# Patient Record
Sex: Female | Born: 1995 | Race: White | Hispanic: No | Marital: Married | Smoking: Never smoker
Health system: Southern US, Community
[De-identification: ages and names within clinical notes are randomized; demographics above are authoritative.]

## PROBLEM LIST (undated history)

## (undated) DIAGNOSIS — D649 Anemia, unspecified: Secondary | ICD-10-CM

## (undated) DIAGNOSIS — N946 Dysmenorrhea, unspecified: Secondary | ICD-10-CM

## (undated) DIAGNOSIS — Z7689 Persons encountering health services in other specified circumstances: Principal | ICD-10-CM

## (undated) DIAGNOSIS — G43909 Migraine, unspecified, not intractable, without status migrainosus: Secondary | ICD-10-CM

## (undated) DIAGNOSIS — N926 Irregular menstruation, unspecified: Secondary | ICD-10-CM

## (undated) DIAGNOSIS — J302 Other seasonal allergic rhinitis: Secondary | ICD-10-CM

## (undated) DIAGNOSIS — F419 Anxiety disorder, unspecified: Secondary | ICD-10-CM

## (undated) HISTORY — PX: TONSILLECTOMY AND ADENOIDECTOMY: SUR1326

## (undated) HISTORY — DX: Anemia, unspecified: D64.9

## (undated) HISTORY — DX: Irregular menstruation, unspecified: N92.6

## (undated) HISTORY — DX: Migraine, unspecified, not intractable, without status migrainosus: G43.909

## (undated) HISTORY — DX: Other seasonal allergic rhinitis: J30.2

## (undated) HISTORY — DX: Persons encountering health services in other specified circumstances: Z76.89

## (undated) HISTORY — PX: WISDOM TOOTH EXTRACTION: SHX21

## (undated) HISTORY — DX: Dysmenorrhea, unspecified: N94.6

---
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
## 2015-03-06 ENCOUNTER — Other Ambulatory Visit (HOSPITAL_COMMUNITY): Payer: Self-pay | Admitting: Family Medicine

## 2015-03-06 DIAGNOSIS — R519 Headache, unspecified: Secondary | ICD-10-CM

## 2015-03-06 DIAGNOSIS — R51 Headache: Principal | ICD-10-CM

## 2015-03-15 ENCOUNTER — Ambulatory Visit (HOSPITAL_COMMUNITY)
Admission: RE | Admit: 2015-03-15 | Discharge: 2015-03-15 | Disposition: A | Discharge status: Home / Self Care | Payer: BLUE CROSS/BLUE SHIELD | Source: Ambulatory Visit | Attending: Family Medicine | Admitting: Family Medicine

## 2015-03-15 DIAGNOSIS — Z68.41 Body mass index (BMI) pediatric, less than 5th percentile for age: Secondary | ICD-10-CM | POA: Insufficient documentation

## 2015-03-15 DIAGNOSIS — R590 Localized enlarged lymph nodes: Secondary | ICD-10-CM | POA: Insufficient documentation

## 2015-03-15 DIAGNOSIS — R519 Headache, unspecified: Secondary | ICD-10-CM

## 2015-03-15 DIAGNOSIS — R51 Headache: Secondary | ICD-10-CM | POA: Diagnosis present

## 2015-03-15 DIAGNOSIS — E049 Nontoxic goiter, unspecified: Secondary | ICD-10-CM | POA: Diagnosis present

## 2015-03-15 MED ORDER — GADOBENATE DIMEGLUMINE 529 MG/ML IV SOLN
7.0000 mL | Freq: Once | INTRAVENOUS | Status: AC | PRN
  Administered 2015-03-15: 7 mL via INTRAVENOUS

## 2015-04-03 ENCOUNTER — Ambulatory Visit (INDEPENDENT_AMBULATORY_CARE_PROVIDER_SITE_OTHER): Payer: BLUE CROSS/BLUE SHIELD | Admitting: Adult Health

## 2015-04-03 ENCOUNTER — Encounter: Payer: Self-pay | Admitting: Adult Health

## 2015-04-03 VITALS — BP 122/70 | HR 82 | Ht 67.0 in | Wt 154.0 lb

## 2015-04-03 DIAGNOSIS — N926 Irregular menstruation, unspecified: Secondary | ICD-10-CM | POA: Diagnosis not present

## 2015-04-03 DIAGNOSIS — Z3202 Encounter for pregnancy test, result negative: Secondary | ICD-10-CM | POA: Diagnosis not present

## 2015-04-03 HISTORY — DX: Irregular menstruation, unspecified: N92.6

## 2015-04-03 LAB — POCT URINE PREGNANCY: Preg Test, Ur: NEGATIVE

## 2015-04-03 MED ORDER — NORETHIN ACE-ETH ESTRAD-FE 1-20 MG-MCG(24) PO CAPS: 1.0000 | ORAL_CAPSULE | Freq: Every day | ORAL | Status: DC

## 2015-04-03 NOTE — Patient Instructions (Signed)
Finish then start taytulla Use condoms

## 2015-04-03 NOTE — Progress Notes (Signed)
Subjective:     Patient ID: Cathy Bailey, female   DOB: 10/30/1995, 20 y.o.   MRN: [id number]  HPI Cathy Bailey is a 20 year old white female, single in complaining of irregular bleeding with junel for several months but then 1 period lasted a week, denies any discharge, burning or pain, no new meds or partners.Freshman at [employer], and [employer].  Review of Systems Patient denies any headaches, hearing loss, fatigue, blurred vision, shortness of breath, chest pain, abdominal pain, problems with bowel movements, urination, or intercourse. No joint pain or mood swings.+irregular periods Reviewed past medical,surgical, social and family history. Reviewed medications and allergies.     Objective:   Physical Exam BP 122/70 mmHg  Pulse 82  Ht  (1.702 m)  Wt 154 lb (69.854 kg)  BMI 24.11 kg/m2  LMP 03/24/2015 UPT negative, talk only, face time 10 minutes, will try different pill to see if irregularity stops and use condoms,finish current pack of pills then start Taytulla.    Assessment:     Irregular periods    Plan:     GC/CHL on urine sent Given 3 packs Taytulla to try , lot [id number] exp 8/18 Will talk in 3 months Use condoms

## 2015-04-06 LAB — GC/CHLAMYDIA PROBE AMP
CHLAMYDIA, DNA PROBE: NEGATIVE
Neisseria gonorrhoeae by PCR: NEGATIVE

## 2015-06-17 ENCOUNTER — Encounter: Payer: Self-pay | Admitting: Adult Health

## 2015-06-18 ENCOUNTER — Other Ambulatory Visit: Payer: Self-pay | Admitting: Adult Health

## 2015-06-18 MED ORDER — NORETHIN ACE-ETH ESTRAD-FE 1-20 MG-MCG(24) PO CAPS: 1.0000 | ORAL_CAPSULE | Freq: Every day | ORAL | Status: DC

## 2015-08-23 ENCOUNTER — Encounter: Payer: Self-pay | Admitting: Adult Health

## 2015-08-29 ENCOUNTER — Emergency Department (HOSPITAL_COMMUNITY)
Admission: EM | Admit: 2015-08-29 | Discharge: 2015-08-30 | Disposition: A | Discharge status: Home / Self Care | Payer: BLUE CROSS/BLUE SHIELD | Attending: Emergency Medicine | Admitting: Emergency Medicine

## 2015-08-29 ENCOUNTER — Encounter (HOSPITAL_COMMUNITY): Payer: Self-pay

## 2015-08-29 DIAGNOSIS — Z792 Long term (current) use of antibiotics: Secondary | ICD-10-CM | POA: Insufficient documentation

## 2015-08-29 DIAGNOSIS — R112 Nausea with vomiting, unspecified: Secondary | ICD-10-CM | POA: Insufficient documentation

## 2015-08-29 DIAGNOSIS — R05 Cough: Secondary | ICD-10-CM | POA: Diagnosis present

## 2015-08-29 DIAGNOSIS — Z9089 Acquired absence of other organs: Secondary | ICD-10-CM | POA: Diagnosis not present

## 2015-08-29 DIAGNOSIS — Z79899 Other long term (current) drug therapy: Secondary | ICD-10-CM | POA: Diagnosis not present

## 2015-08-29 DIAGNOSIS — J029 Acute pharyngitis, unspecified: Secondary | ICD-10-CM | POA: Insufficient documentation

## 2015-08-29 LAB — RAPID STREP SCREEN (MED CTR MEBANE ONLY): STREPTOCOCCUS, GROUP A SCREEN (DIRECT): NEGATIVE

## 2015-08-29 NOTE — ED Notes (Signed)
Bed: [facility] Expected date:  Expected time:  Means of arrival:  Comments: 

## 2015-08-29 NOTE — ED Notes (Addendum)
Patient states that has had a cough and sore throat since Monday this week.  Patient states that saw PCP Tuesday and was given z-pak.  Patient was also tested for strep which was negative.  Patient states that has been running fevers at home.   Patient states still has sore throat and states that feels like "has silly putty" in her throat and is difficult to swallow,  Patient denies SOB or difficulty breathing. Patient is talking and laughing in triage and NAD at this time.   Patient states that has seasonal allergies that she takes a daily "generic" allergy pill for.  Patient states that when she blows her nose that it makes her "throw up" and today noticed that there was blood in her vomitus.  Patient states they were small clots of blood.  Patient has been taking tylenol for pain last took 1000mg  at [id number].  Patient rates throat pain 8/10.

## 2015-08-30 LAB — BASIC METABOLIC PANEL
Anion gap: 10 (ref 5–15)
BUN: 9 mg/dL (ref 6–20)
CHLORIDE: 105 mmol/L (ref 101–111)
CO2: 23 mmol/L (ref 22–32)
CREATININE: 0.51 mg/dL (ref 0.44–1.00)
Calcium: 9.3 mg/dL (ref 8.9–10.3)
GFR calc non Af Amer: >60 mL/min (ref >60)
Glucose, Bld: 122 mg/dL — ABNORMAL HIGH (ref 65–99)
POTASSIUM: 3.9 mmol/L (ref 3.5–5.1)
Sodium: 138 mmol/L (ref 135–145)

## 2015-08-30 LAB — CBC WITH DIFFERENTIAL/PLATELET
Basophils Absolute: 0 10*3/uL (ref 0.0–0.1)
Basophils Relative: 0 %
EOS ABS: 0 10*3/uL (ref 0.0–0.7)
Eosinophils Relative: 0 %
HCT: 42.3 % (ref 36.0–46.0)
HEMOGLOBIN: 13.5 g/dL (ref 12.0–15.0)
LYMPHS ABS: 1.4 10*3/uL (ref 0.7–4.0)
LYMPHS PCT: 9 %
MCH: 28.5 pg (ref 26.0–34.0)
MCHC: 31.9 g/dL (ref 30.0–36.0)
MCV: 89.4 fL (ref 78.0–100.0)
Monocytes Absolute: 1 10*3/uL (ref 0.1–1.0)
Monocytes Relative: 7 %
NEUTROS PCT: 84 %
Neutro Abs: 12.9 10*3/uL — ABNORMAL HIGH (ref 1.7–7.7)
Platelets: 307 10*3/uL (ref 150–400)
RBC: 4.73 MIL/uL (ref 3.87–5.11)
RDW: 11.9 % (ref 11.5–15.5)
WBC: 15.4 10*3/uL — AB (ref 4.0–10.5)

## 2015-08-30 MED ORDER — SODIUM CHLORIDE 0.9 % IV BOLUS (SEPSIS)
1000.0000 mL | Freq: Once | INTRAVENOUS | Status: AC
  Administered 2015-08-30: 1000 mL via INTRAVENOUS

## 2015-08-30 MED ORDER — ONDANSETRON HCL 4 MG PO TABS: 4.0000 mg | ORAL_TABLET | Freq: Four times a day (QID) | ORAL | Status: DC

## 2015-08-30 MED ORDER — ACETAMINOPHEN 325 MG PO TABS
650.0000 mg | ORAL_TABLET | Freq: Once | ORAL | Status: AC
  Administered 2015-08-30: 650 mg via ORAL
  Filled 2015-08-30: qty 2

## 2015-08-30 NOTE — ED Provider Notes (Signed)
CSN: [id number]     Arrival date & time 08/29/15  2101 History   First MD Initiated Contact with Patient 08/30/15 0010     Chief Complaint  Patient presents with  . Cough  . Sore Throat     (Consider location/radiation/quality/duration/timing/severity/associated sxs/prior Treatment) HPI Comments: Patient with no other medical problems presents with sore throat, nausea and vomiting x 2 per day for the past 5 days. Tonight she saw a significant amount of blood in her emesis with small clots, prompting visit to the emergency department. No rash. She went to her doctor's office one day after start of symptoms, had a negative strep test and was placed on a Z-pac at that time. She reports no improvement in symptoms.   Patient is a 20 y.o. female presenting with cough and pharyngitis. The history is provided by the patient and a parent. No language interpreter was used.  Cough Associated symptoms: fever and sore throat   Associated symptoms: no chills and no rash   Sore Throat Associated symptoms include coughing, a fever, nausea, a sore throat and vomiting. Pertinent negatives include no chills, congestion or rash.    Past Medical History  Diagnosis Date  . Seasonal allergies   . Dysmenorrhea 07/05/2012  . Encounter for menstrual extraction 01/22/2014  . Irregular bleeding 05/24/2014  . Anemia   . Irregular periods 04/03/2015   Past Surgical History  Procedure Laterality Date  . Tonsillectomy and adenoidectomy    . Wisdom tooth extraction     Family History  Problem Relation Age of Onset  . Hypertension Father   . Cancer Maternal Grandmother 60    breast  . Hyperlipidemia Maternal Grandmother   . Hypertension Maternal Grandfather   . Macular degeneration Maternal Grandfather   . Atrial fibrillation Maternal Grandfather   . Congestive Heart Failure Maternal Grandfather   . Thyroid disease Paternal Grandmother   . Hypertension Paternal Grandmother   . Atrial fibrillation Paternal  Grandmother   . Hypertension Paternal Grandfather    Social History  Substance Use Topics  . Smoking status: Never Smoker   . Smokeless tobacco: Never Used  . Alcohol Use: No   OB History    Gravida Para Term Preterm AB TAB SAB Ectopic Multiple Living   0 0 0 0 0 0 0 0 0 0      Review of Systems  Constitutional: Positive for fever. Negative for chills.  HENT: Positive for sore throat and trouble swallowing. Negative for congestion.   Respiratory: Positive for cough.   Cardiovascular: Negative.   Gastrointestinal: Positive for nausea and vomiting.  Musculoskeletal: Negative.  Negative for neck stiffness.  Skin: Negative.  Negative for rash.  Neurological: Negative.       Allergies  Review of patient's allergies indicates no known allergies.  Home Medications   Prior to Admission medications   Medication Sig Start Date End Date Taking? Authorizing Provider  acetaminophen (TYLENOL) 500 MG tablet Take 500 mg by mouth every 6 (six) hours as needed for mild pain.   Yes Historical Provider, MD  azithromycin (ZITHROMAX) 250 MG tablet Take 250 mg by mouth daily.   Yes Historical Provider, MD  cetirizine (ZYRTEC) 10 MG tablet Take 10 mg by mouth as needed for allergies.   Yes Historical Provider, MD  ibuprofen (ADVIL,MOTRIN) 200 MG tablet Take 200 mg by mouth every 6 (six) hours as needed for pain.   Yes Historical Provider, MD  IRON PO Take 65 mg by mouth daily.   Yes  Historical Provider, MD  Norethin Ace-Eth Estrad-FE (TAYTULLA) 1-20 MG-MCG(24) CAPS Take 1 tablet by mouth daily. 06/18/15  Yes Adline Potter, NP   BP 130/86 mmHg  Pulse 104  Temp(Src) 99.7 F (37.6 C) (Oral)  Resp 15  Ht  (1.702 m)  Wt 68.04 kg  BMI 23.49 kg/m2  SpO2 100%  LMP 08/25/2015 (Exact Date) Physical Exam  Constitutional: She is oriented to person, place, and time. She appears well-developed and well-nourished.  HENT:  Head: Normocephalic.  Mouth/Throat: Uvula is midline. Mucous membranes  are dry. Posterior oropharyngeal erythema present. No oropharyngeal exudate.  Neck: Normal range of motion. Neck supple.  No posterior lymph node swelling.  Cardiovascular: Normal rate and regular rhythm.   No murmur heard. Pulmonary/Chest: Effort normal and breath sounds normal. She has no wheezes. She has no rales. She exhibits no tenderness.  Abdominal: Soft. Bowel sounds are normal. There is no tenderness (specifically, no LUQ tenderness. ). There is no rebound and no guarding.  Musculoskeletal: Normal range of motion.  Lymphadenopathy:    She has cervical adenopathy.  Neurological: She is alert and oriented to person, place, and time.  Skin: Skin is warm and dry. No rash noted.  Psychiatric: She has a normal mood and affect.    ED Course  Procedures (including critical care time) Labs Review Labs Reviewed  CBC WITH DIFFERENTIAL/PLATELET - Abnormal; Notable for the following:    WBC 15.4 (*)    Neutro Abs 12.9 (*)    All other components within normal limits  BASIC METABOLIC PANEL - Abnormal; Notable for the following:    Glucose, Bld 122 (*)    All other components within normal limits  RAPID STREP SCREEN (NOT AT [facility])  CULTURE, GROUP A STREP [facility])   Results for orders placed or performed during the hospital encounter of 08/29/15  Rapid strep screen  Result Value Ref Range   Streptococcus, Group A Screen (Direct) NEGATIVE NEGATIVE  CBC with Differential  Result Value Ref Range   WBC 15.4 (H) 4.0 - 10.5 K/uL   RBC 4.73 3.87 - 5.11 MIL/uL   Hemoglobin 13.5 12.0 - 15.0 g/dL   HCT 16.1 09.6 - 04.5 %   MCV 89.4 78.0 - 100.0 fL   MCH 28.5 26.0 - 34.0 pg   MCHC 31.9 30.0 - 36.0 g/dL   RDW 40.9 81.1 - 91.4 %   Platelets 307 150 - 400 K/uL   Neutrophils Relative % 84 %   Neutro Abs 12.9 (H) 1.7 - 7.7 K/uL   Lymphocytes Relative 9 %   Lymphs Abs 1.4 0.7 - 4.0 K/uL   Monocytes Relative 7 %   Monocytes Absolute 1.0 0.1 - 1.0 K/uL   Eosinophils Relative 0 %   Eosinophils  Absolute 0.0 0.0 - 0.7 K/uL   Basophils Relative 0 %   Basophils Absolute 0.0 0.0 - 0.1 K/uL  Basic metabolic panel  Result Value Ref Range   Sodium 138 135 - 145 mmol/L   Potassium 3.9 3.5 - 5.1 mmol/L   Chloride 105 101 - 111 mmol/L   CO2 23 22 - 32 mmol/L   Glucose, Bld 122 (H) 65 - 99 mg/dL   BUN 9 6 - 20 mg/dL   Creatinine, Ser 7.82 0.44 - 1.00 mg/dL   Calcium 9.3 8.9 - 95.6 mg/dL   GFR calc non Af Amer >60 >60 mL/min   GFR calc Af Amer >60 >60 mL/min   Anion gap 10 5 - 15  Imaging Review No results found. I have personally reviewed and evaluated these images and lab results as part of my medical decision-making.   EKG Interpretation None      MDM   Final diagnoses:  None    1. Pharyngitis  Patient with N, V, sore throat for one week. On z-pac without improvement, with hematemesis today x 1, no vomiting since. Labs are essentially unremarkable except for mild leukocytosis without shift. IVF's provided. She is drinking fluids in the room with adverse affect. She can be discharged home with PCP follow up is symptoms persist.     Elpidio AnisShari Keelie Zemanek, PA-C 08/30/15 0222  April Palumbo, MD 08/30/15 [id number]

## 2015-08-30 NOTE — Discharge Instructions (Signed)

## 2015-09-01 LAB — CULTURE, GROUP A STREP (THRC)

## 2015-11-05 ENCOUNTER — Encounter: Payer: Self-pay | Admitting: Adult Health

## 2015-11-06 ENCOUNTER — Other Ambulatory Visit: Payer: Self-pay | Admitting: Adult Health

## 2015-11-06 MED ORDER — NORETHIN ACE-ETH ESTRAD-FE 1-20 MG-MCG PO TABS: 1.0000 | ORAL_TABLET | Freq: Every day | ORAL | 11 refills | Status: DC

## 2016-04-02 ENCOUNTER — Encounter: Payer: Self-pay | Admitting: Adult Health

## 2016-04-02 ENCOUNTER — Ambulatory Visit (INDEPENDENT_AMBULATORY_CARE_PROVIDER_SITE_OTHER): Payer: 59 | Admitting: Adult Health

## 2016-04-02 VITALS — BP 116/70 | HR 92 | Ht 67.0 in | Wt 154.0 lb

## 2016-04-02 DIAGNOSIS — N938 Other specified abnormal uterine and vaginal bleeding: Secondary | ICD-10-CM

## 2016-04-02 DIAGNOSIS — N926 Irregular menstruation, unspecified: Secondary | ICD-10-CM

## 2016-04-02 DIAGNOSIS — Z3041 Encounter for surveillance of contraceptive pills: Secondary | ICD-10-CM

## 2016-04-02 MED ORDER — LEVONORGEST-ETH ESTRAD 91-DAY 0.15-0.03 MG PO TABS: 1.0000 | ORAL_TABLET | Freq: Every day | ORAL | 4 refills | Status: DC

## 2016-04-02 NOTE — Patient Instructions (Signed)
Pap and physical in August Use condoms Finish current pack of pills then start new pills

## 2016-04-02 NOTE — Progress Notes (Signed)
Subjective:     Patient ID: Cathy PiggKaitlyn Bailey, female   DOB: 12-24-1995, 20 y.o.   MRN: [id number]  HPI Cathy Bailey is a 21 year old white female in to discuss birth control, is having break through bleeding, on OCs.She is now on topamax for headaches, no auras.   Review of Systems Break through bleeding on OCs +headaches, on topamax  Reviewed past medical,surgical, social and family history. Reviewed medications and allergies.     Objective:   Physical Exam BP 116/70 (BP Location: Left Arm, Patient Position: Sitting, Cuff Size: Normal)   Pulse 92   Ht 5\' 7"  (1.702 m)   Wt 154 lb (69.9 kg)   LMP 03/25/2016 (Exact Date)   BMI 24.12 kg/m    PHQ 2 score 0. Skin warm and dry.  Lungs: clear to ausculation bilaterally. Cardiovascular: regular rate and rhythm.Discussed options, like OCs, depo, IUD and nexplanon, will try another OC but review handout on nexplanon.  Assessment:     1. Encounter for surveillance of contraceptive pills   2. Irregular bleeding       Plan:     Rx seasonal take 1 daily with 4 refills Use condoms Review handout on nexplanon Return in August for pap and physical

## 2016-05-06 ENCOUNTER — Encounter: Payer: Self-pay | Admitting: Adult Health

## 2016-08-25 ENCOUNTER — Encounter: Payer: Self-pay | Admitting: Adult Health

## 2016-10-14 ENCOUNTER — Other Ambulatory Visit: Payer: 59 | Admitting: Adult Health

## 2016-10-22 ENCOUNTER — Encounter: Payer: Self-pay | Admitting: Adult Health

## 2016-10-22 ENCOUNTER — Ambulatory Visit (INDEPENDENT_AMBULATORY_CARE_PROVIDER_SITE_OTHER): Payer: PRIVATE HEALTH INSURANCE | Admitting: Adult Health

## 2016-10-22 ENCOUNTER — Other Ambulatory Visit (HOSPITAL_COMMUNITY)
Admission: RE | Admit: 2016-10-22 | Discharge: 2016-10-22 | Disposition: A | Discharge status: Home / Self Care | Payer: PRIVATE HEALTH INSURANCE | Source: Ambulatory Visit | Attending: Adult Health | Admitting: Adult Health

## 2016-10-22 VITALS — BP 120/80 | HR 95 | Ht 67.75 in | Wt 162.5 lb

## 2016-10-22 DIAGNOSIS — Z01419 Encounter for gynecological examination (general) (routine) without abnormal findings: Secondary | ICD-10-CM | POA: Diagnosis present

## 2016-10-22 DIAGNOSIS — Z3041 Encounter for surveillance of contraceptive pills: Secondary | ICD-10-CM | POA: Diagnosis not present

## 2016-10-22 MED ORDER — JUNEL FE 1/20 1-20 MG-MCG PO TABS: ORAL_TABLET | ORAL | 4 refills | Status: DC

## 2016-10-22 NOTE — Progress Notes (Signed)
Patient ID: Cathy Bailey, female   DOB: 1995/10/15, 21 y.o.   MRN: [id number] History of Present Illness: Lillion is a 21 year old white female, single, G0P0, Jr at [employer], if for well woman gyn exam and first pap.She is happy with her OCs.    Current Medications, Allergies, Past Medical History, Past Surgical History, Family History and Social History were reviewed in [employer] record.     Review of Systems: Patient denies any daily headaches, hearing loss, fatigue, blurred vision, shortness of breath, chest pain, abdominal pain, problems with bowel movements, urination, or intercourse. No joint pain or mood swings.    Physical Exam:BP 120/80 (BP Location: Left Arm, Patient Position: Sitting, Cuff Size: Normal)   Pulse 95   Ht 5' 7.75" (1.721 m)   Wt 162 lb 8 oz (73.7 kg)   BMI 24.89 kg/m  General:  Well developed, well nourished, no acute distress Skin:  Warm and dry Neck:  Midline trachea, normal thyroid, good ROM, no lymphadenopathy Lungs; Clear to auscultation bilaterally Breast:  No dominant palpable mass, retraction, or nipple discharge Cardiovascular: Regular rate and rhythm Abdomen:  Soft, non tender, no hepatosplenomegaly Pelvic:  External genitalia is normal in appearance, no lesions.  The vagina is normal in appearance. +period like blood.Urethra has no lesions or masses. The cervix is smooth, pap with GC/CHL performed.  Uterus is felt to be normal size, shape, and contour.  No adnexal masses or tenderness noted.Bladder is non tender, no masses felt. Extremities/musculoskeletal:  No swelling or varicosities noted, no clubbing or cyanosis Psych:  No mood changes, alert and cooperative,seems happy PHQ 2 score 0.  Impression:  1. Encounter for gynecological examination with Papanicolaou smear of cervix   2. Encounter for surveillance of contraceptive pills      Plan: Refilled Junel 1-20 take 1 daily , #3 packs with 4 refills Physical in 1  year Pap in 2-3 if normal

## 2016-10-23 LAB — CYTOLOGY - PAP
ADEQUACY: ABSENT
CHLAMYDIA, DNA PROBE: NEGATIVE
DIAGNOSIS: NEGATIVE
NEISSERIA GONORRHEA: NEGATIVE

## 2016-12-20 ENCOUNTER — Other Ambulatory Visit: Payer: Self-pay | Admitting: Adult Health

## 2018-02-01 ENCOUNTER — Ambulatory Visit: Payer: PRIVATE HEALTH INSURANCE | Admitting: Adult Health

## 2018-03-15 ENCOUNTER — Encounter: Payer: Self-pay | Admitting: Adult Health

## 2018-03-15 ENCOUNTER — Ambulatory Visit: Payer: PRIVATE HEALTH INSURANCE | Admitting: Adult Health

## 2018-08-05 ENCOUNTER — Other Ambulatory Visit: Payer: Self-pay | Admitting: Family Medicine

## 2018-08-05 ENCOUNTER — Other Ambulatory Visit (HOSPITAL_COMMUNITY): Payer: Self-pay | Admitting: Family Medicine

## 2018-08-05 DIAGNOSIS — E049 Nontoxic goiter, unspecified: Secondary | ICD-10-CM

## 2018-08-11 ENCOUNTER — Other Ambulatory Visit: Payer: Self-pay

## 2018-08-11 ENCOUNTER — Other Ambulatory Visit (HOSPITAL_COMMUNITY): Payer: Self-pay | Admitting: Family Medicine

## 2018-08-11 ENCOUNTER — Ambulatory Visit (HOSPITAL_COMMUNITY)
Admission: RE | Admit: 2018-08-11 | Discharge: 2018-08-11 | Disposition: A | Discharge status: Home / Self Care | Payer: No Typology Code available for payment source | Source: Ambulatory Visit | Attending: Family Medicine | Admitting: Family Medicine

## 2018-08-11 DIAGNOSIS — E049 Nontoxic goiter, unspecified: Secondary | ICD-10-CM | POA: Insufficient documentation

## 2018-08-11 DIAGNOSIS — E041 Nontoxic single thyroid nodule: Secondary | ICD-10-CM

## 2018-08-16 ENCOUNTER — Ambulatory Visit: Payer: Self-pay | Admitting: General Surgery

## 2018-08-18 ENCOUNTER — Other Ambulatory Visit (HOSPITAL_COMMUNITY): Payer: Self-pay | Admitting: Family Medicine

## 2018-08-18 ENCOUNTER — Ambulatory Visit (HOSPITAL_COMMUNITY)
Admission: RE | Admit: 2018-08-18 | Discharge: 2018-08-18 | Disposition: A | Discharge status: Home / Self Care | Payer: 59 | Source: Ambulatory Visit | Attending: Family Medicine | Admitting: Family Medicine

## 2018-08-18 ENCOUNTER — Other Ambulatory Visit: Payer: Self-pay

## 2018-08-18 DIAGNOSIS — E041 Nontoxic single thyroid nodule: Secondary | ICD-10-CM | POA: Insufficient documentation

## 2018-08-25 ENCOUNTER — Ambulatory Visit: Payer: Self-pay | Admitting: General Surgery

## 2018-09-09 ENCOUNTER — Other Ambulatory Visit: Payer: PRIVATE HEALTH INSURANCE

## 2018-09-09 ENCOUNTER — Other Ambulatory Visit: Payer: Self-pay

## 2018-09-09 DIAGNOSIS — Z20822 Contact with and (suspected) exposure to covid-19: Secondary | ICD-10-CM

## 2018-09-13 LAB — NOVEL CORONAVIRUS, NAA: SARS-CoV-2, NAA: NOT DETECTED

## 2019-03-07 ENCOUNTER — Other Ambulatory Visit (HOSPITAL_COMMUNITY): Payer: Self-pay | Admitting: Internal Medicine

## 2019-03-07 ENCOUNTER — Other Ambulatory Visit: Payer: Self-pay | Admitting: Internal Medicine

## 2019-03-07 DIAGNOSIS — E049 Nontoxic goiter, unspecified: Secondary | ICD-10-CM

## 2019-03-14 ENCOUNTER — Ambulatory Visit (HOSPITAL_COMMUNITY)
Admission: RE | Admit: 2019-03-14 | Discharge: 2019-03-14 | Disposition: A | Discharge status: Home / Self Care | Payer: BC Managed Care – PPO | Source: Ambulatory Visit | Attending: Internal Medicine | Admitting: Internal Medicine

## 2019-03-14 ENCOUNTER — Other Ambulatory Visit: Payer: Self-pay

## 2019-03-14 DIAGNOSIS — E049 Nontoxic goiter, unspecified: Secondary | ICD-10-CM | POA: Diagnosis not present

## 2020-01-23 ENCOUNTER — Encounter: Payer: Self-pay | Admitting: Psychiatry

## 2020-01-23 ENCOUNTER — Other Ambulatory Visit: Payer: Self-pay

## 2020-01-23 ENCOUNTER — Ambulatory Visit (INDEPENDENT_AMBULATORY_CARE_PROVIDER_SITE_OTHER): Payer: BC Managed Care – PPO | Admitting: Psychiatry

## 2020-01-23 VITALS — BP 163/88 | HR 108 | Ht 69.0 in | Wt 190.0 lb

## 2020-01-23 DIAGNOSIS — F411 Generalized anxiety disorder: Secondary | ICD-10-CM

## 2020-01-23 MED ORDER — SERTRALINE HCL 50 MG PO TABS: ORAL_TABLET | ORAL | 1 refills | Status: DC
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
## 2020-02-09 ENCOUNTER — Telehealth: Payer: Self-pay | Admitting: Psychiatry

## 2020-02-09 NOTE — Telephone Encounter (Signed)
Patient's next appointment is 03/15/20 with Shanda Bumps. She has been taking generic Zoloft two 50 mg daily but her insurance doesn't pay for this anymore. They only pay for 100 mg 2/daily. [facility]amacy is [facility], [address] [phone].

## 2020-02-14 ENCOUNTER — Telehealth: Payer: Self-pay | Admitting: Psychiatry

## 2020-02-14 DIAGNOSIS — F411 Generalized anxiety disorder: Secondary | ICD-10-CM

## 2020-02-14 MED ORDER — SERTRALINE HCL 100 MG PO TABS: 100.0000 mg | ORAL_TABLET | Freq: Every day | ORAL | 1 refills | Status: DC

## 2020-02-14 NOTE — Telephone Encounter (Signed)
Cathy Bailey's next visit is 03/15/20. Her Sertraline dosage is 100 mg. The prescription she got at last visit was for 50 mg. Insurance will not pay for the 50 mg because there is a 100 mg for Sertraline. [facility] [address]. [phone]

## 2020-02-14 NOTE — Telephone Encounter (Signed)
Sent script for 100 mg tabs

## 2020-02-20 NOTE — Telephone Encounter (Signed)
Rx sent for 100 mg. 

## 2020-03-07 ENCOUNTER — Other Ambulatory Visit: Payer: Self-pay | Admitting: Psychiatry

## 2020-03-07 DIAGNOSIS — F411 Generalized anxiety disorder: Secondary | ICD-10-CM

## 2020-03-15 ENCOUNTER — Other Ambulatory Visit: Payer: Self-pay

## 2020-03-15 ENCOUNTER — Ambulatory Visit (INDEPENDENT_AMBULATORY_CARE_PROVIDER_SITE_OTHER): Payer: BC Managed Care – PPO | Admitting: Psychiatry

## 2020-03-15 ENCOUNTER — Encounter: Payer: Self-pay | Admitting: Psychiatry

## 2020-03-15 DIAGNOSIS — G4700 Insomnia, unspecified: Secondary | ICD-10-CM | POA: Diagnosis not present

## 2020-03-15 DIAGNOSIS — F411 Generalized anxiety disorder: Secondary | ICD-10-CM

## 2020-03-15 MED ORDER — SERTRALINE HCL 100 MG PO TABS: 150.0000 mg | ORAL_TABLET | Freq: Every day | ORAL | 0 refills | Status: DC

## 2020-03-15 MED ORDER — TRAZODONE HCL 100 MG PO TABS: ORAL_TABLET | ORAL | 2 refills | Status: DC

## 2020-03-15 NOTE — Progress Notes (Signed)
Cathy Bailey [id number] 11-23-1995 25 y.o.  Subjective:   Patient ID:  Cathy Bailey is a 25 y.o. (DOB [birth year]) female.  Chief Complaint: No chief complaint on file.   HPI Karlene Southard presents to the office today for follow-up of anxiety, depression, and insomnia. She reports that her "days are a lot better but I am still struggling at night." She reports that she feels more calm and relaxed during the day and then notices anxiety around 7 pm and is replaying scenarios. She reports that this interferes with sleep initiation. She reports going to bed between 7-8 pm and will think about things from the day and the past. She does not get into a deep sleep until 11 or midnight. If she tries to go to bed later she falls asleep later. She has been having vivid dreams.   She has not had physical s/s of anxiety during the day. She reports that she feels increased heart rate with anxiety at night. Continues to have frequent headaches. She reports that anxiety has not been triggering depression during the day recently and notices depression at night. Notices some irritability at night. Irritability is infrequent during the day. She reports that her motivation has been low recently. Energy is somewhat low. She reports that she has periods of motivation. She reports that her concentration is improved in the morning and that she has difficulty staying focused and being tired at the end of the day. Appetite has been about the same. Has been packing lunch. Denies SI.   She reports that she has stopped her birth control this month to try to conceive.   Past Psychiatric Medication Trials: Zonisamide- Prescribed for headaches. Not effective. Topamax- Prescribed for headaches. Ineffective  PHQ2-9   Flowsheet Row Office Visit from 10/22/2016 in [facility] Office Visit from 04/02/2016 in [facility]  PHQ-2 Total Score 0 0       Review of Systems:  Review of Systems   Musculoskeletal: Negative for gait problem.  Neurological: Positive for headaches. Negative for tremors.  Psychiatric/Behavioral:       Please refer to HPI    Medications: I have reviewed the patient's current medications.  Current Outpatient Medications  Medication Sig Dispense Refill  . Ferrous Sulfate (IRON PO) Take by mouth.    . naproxen sodium (ALEVE) 220 MG tablet Take 220 mg by mouth.    . Prenatal Vit-Fe Fumarate-FA (PRENATAL PO) Take by mouth.    . sertraline (ZOLOFT) 100 MG tablet Take 1.5 tablets (150 mg total) by mouth daily. 135 tablet 0  . traZODone (DESYREL) 100 MG tablet Take 1/2-1 tablet po QHS prn insomnia 30 tablet 2   No current facility-administered medications for this visit.    Medication Side Effects: Other: Vivid dreams. Sleep disturbance with taking Sertraline at night  Allergies: No Known Allergies  Past Medical History:  Diagnosis Date  . Anemia   . Dysmenorrhea 07/05/2012  . Encounter for menstrual extraction 01/22/2014  . Irregular bleeding 05/24/2014  . Irregular periods 04/03/2015  . Migraines   . Seasonal allergies     Family History  Problem Relation Age of Onset  . Hypertension Father   . Cancer Maternal Grandmother 60       breast  . Hyperlipidemia Maternal Grandmother   . Hypertension Maternal Grandfather   . Macular degeneration Maternal Grandfather   . Atrial fibrillation Maternal Grandfather   . Congestive Heart Failure Maternal Grandfather   . Thyroid disease Paternal Grandmother   .  Hypertension Paternal Grandmother   . Atrial fibrillation Paternal Grandmother   . Hypertension Paternal Grandfather     Social History   Socioeconomic History  . Marital status: Single    Spouse name: Not on file  . Number of children: Not on file  . Years of education: Not on file  . Highest education level: Not on file  Occupational History  . Not on file  Tobacco Use  . Smoking status: Never Smoker  . Smokeless tobacco: Never Used   Substance and Sexual Activity  . Alcohol use: Yes    Alcohol/week: 3.0 - 4.0 standard drinks    Types: 3 - 4 Glasses of wine per week  . Drug use: No  . Sexual activity: Yes    Birth control/protection: Pill  Other Topics Concern  . Not on file  Social History Narrative  . Not on file   Social Determinants of Health   Financial Resource Strain: Not on file  Food Insecurity: Not on file  Transportation Needs: Not on file  Physical Activity: Not on file  Stress: Not on file  Social Connections: Not on file  Intimate Partner Violence: Not on file    Past Medical History, Surgical history, Social history, and Family history were reviewed and updated as appropriate.   Please see review of systems for further details on the patient's review from today.   Objective:   Physical Exam:  There were no vitals taken for this visit.  Physical Exam Constitutional:      General: She is not in acute distress. Musculoskeletal:        General: No deformity.  Neurological:     Mental Status: She is alert and oriented to person, place, and time.     Coordination: Coordination normal.  Psychiatric:        Attention and Perception: Attention and perception normal. She does not perceive auditory or visual hallucinations.        Mood and Affect: Mood is anxious. Mood is not depressed. Affect is not labile, blunt, angry or inappropriate.        Speech: Speech normal.        Behavior: Behavior normal.        Thought Content: Thought content normal. Thought content is not paranoid or delusional. Thought content does not include homicidal or suicidal ideation. Thought content does not include homicidal or suicidal plan.        Cognition and Memory: Cognition and memory normal.        Judgment: Judgment normal.     Comments: Insight intact Mood presents as less anxious and less depressed     Lab Review:     Component Value Date/Time   NA 138 08/30/2015 0101   K 3.9 08/30/2015 0101   CL  105 08/30/2015 0101   CO2 23 08/30/2015 0101   GLUCOSE 122 (H) 08/30/2015 0101   BUN 9 08/30/2015 0101   CREATININE 0.51 08/30/2015 0101   CALCIUM 9.3 08/30/2015 0101   GFRNONAA >60 08/30/2015 0101   GFRAA >60 08/30/2015 0101       Component Value Date/Time   WBC 15.4 (H) 08/30/2015 0101   RBC 4.73 08/30/2015 0101   HGB 13.5 08/30/2015 0101   HCT 42.3 08/30/2015 0101   PLT 307 08/30/2015 0101   MCV 89.4 08/30/2015 0101   MCH 28.5 08/30/2015 0101   MCHC 31.9 08/30/2015 0101   RDW 11.9 08/30/2015 0101   LYMPHSABS 1.4 08/30/2015 0101   MONOABS 1.0 08/30/2015  0101   EOSABS 0.0 08/30/2015 0101   BASOSABS 0.0 08/30/2015 0101    No results found for: POCLITH, LITHIUM   No results found for: PHENYTOIN, PHENOBARB, VALPROATE, CBMZ   .res Assessment: Plan:    Patient seen for 30 minutes and time spent counseling the patient regarding potential benefits, risks, and side effects of increasing sertraline to 150 mg daily for mood and anxiety signs and symptoms that she has experienced some partial improvement with lower dose.  Patient agrees to increase in sertraline. Discussed potential benefits, risks, and side effects of trazodone.  She agrees to a trial of trazodone.  We will start trazodone 100 mg 1/2-1 tab p.o. nightly as needed insomnia.  Reviewed safety data during pregnancy from up to date for both trazodone and sertraline with patient. Patient to follow up in 1 to 2 months or sooner if clinically indicated. Patient advised to contact office with any questions, adverse effects, or acute worsening in signs and symptoms.   Diagnoses and all orders for this visit:  Insomnia, unspecified type -     traZODone (DESYREL) 100 MG tablet; Take 1/2-1 tablet po QHS prn insomnia  Generalized anxiety disorder -     sertraline (ZOLOFT) 100 MG tablet; Take 1.5 tablets (150 mg total) by mouth daily.     Please see After Visit Summary for patient specific instructions.  Future  Appointments  Date Time Provider Department Center  05/07/2020  4:00 PM Corie Chiquito, PMHNP [facility] None    No orders of the defined types were placed in this encounter.   -------------------------------

## 2020-04-06 ENCOUNTER — Other Ambulatory Visit: Payer: Self-pay | Admitting: Psychiatry

## 2020-04-06 DIAGNOSIS — G4700 Insomnia, unspecified: Secondary | ICD-10-CM

## 2020-05-07 ENCOUNTER — Ambulatory Visit: Payer: BC Managed Care – PPO | Admitting: Psychiatry

## 2020-05-29 IMAGING — US US THYROID
1 series · 13 of 25 positions shown · non-contrast
Comparison: None.

CLINICAL DATA: ENLARGED THYROID ON EXAM

EXAM:
THYROID ULTRASOUND
TECHNIQUE: Ultrasound examination of the thyroid gland and adjacent soft
tissues was performed.

[Series 1: us thyroid · 0.06mm/px · 13 of 106 slices shown]
[im 1/106]
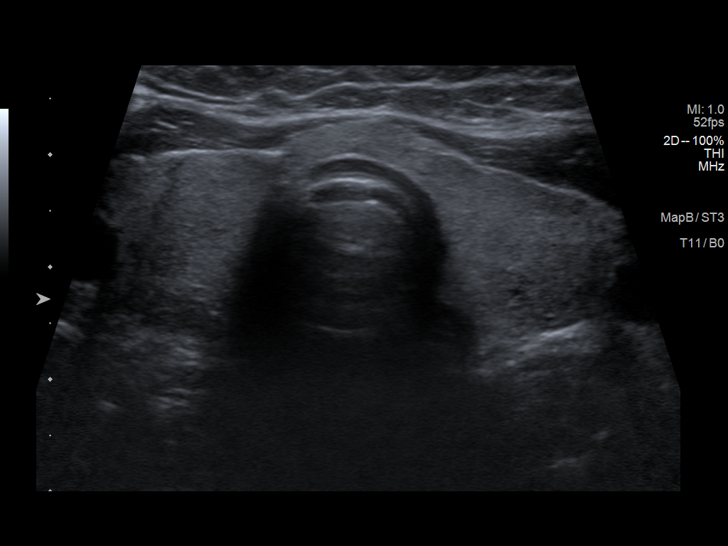
[im 9/106]
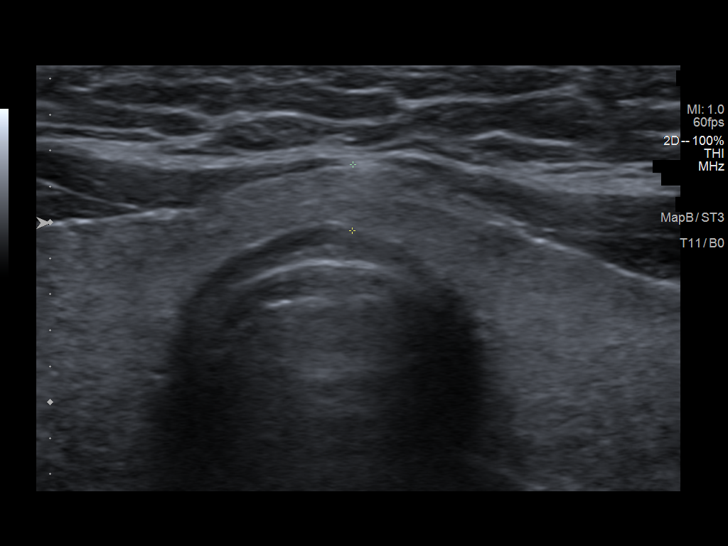
[im 18/106]
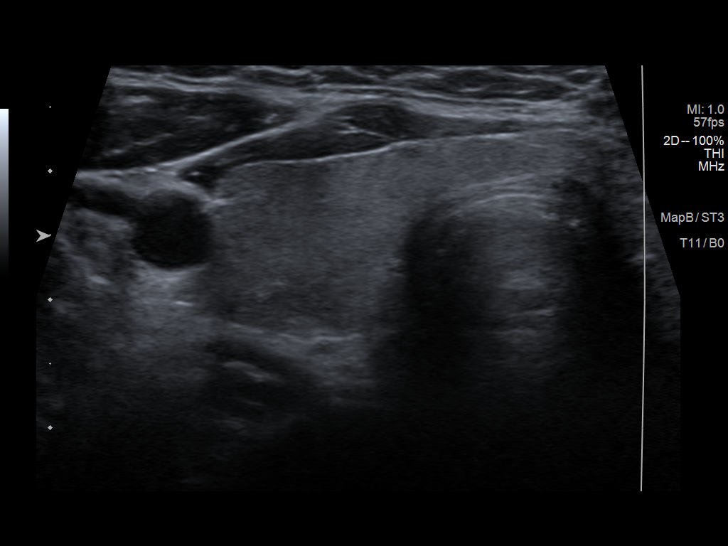
[im 27/106]
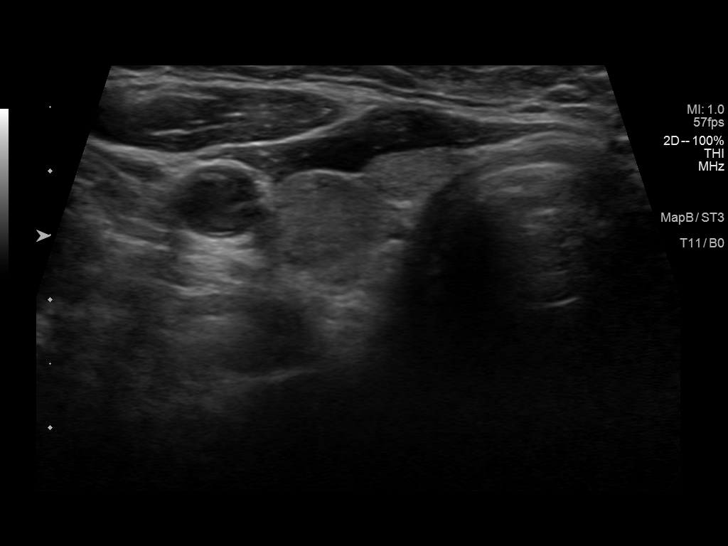
[im 36/106]
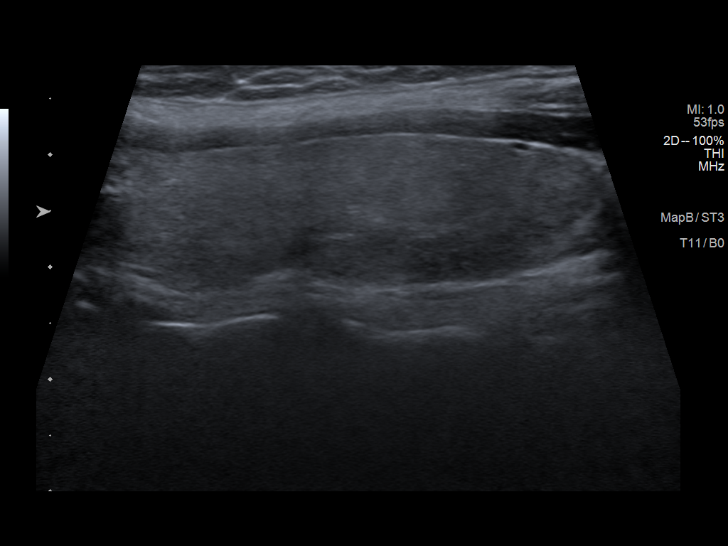
[im 44/106]
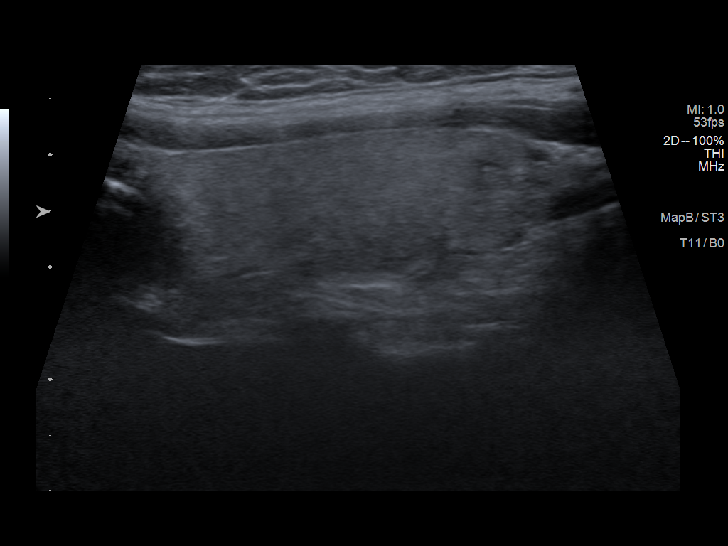
[im 53/106]
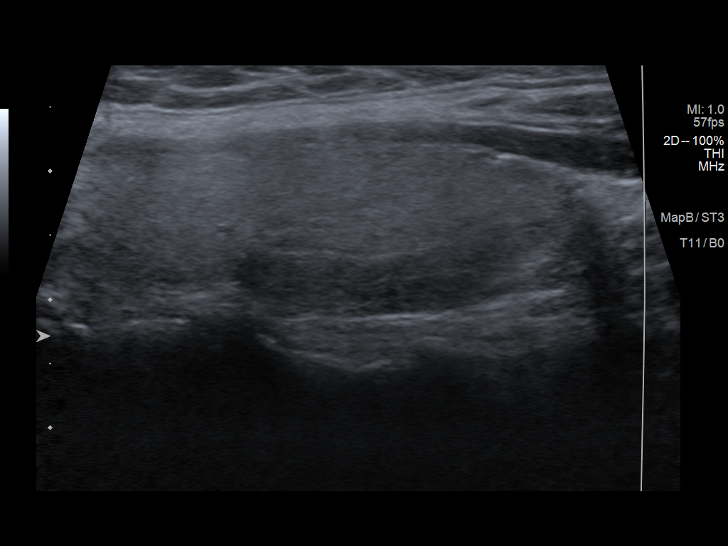
[im 62/106]
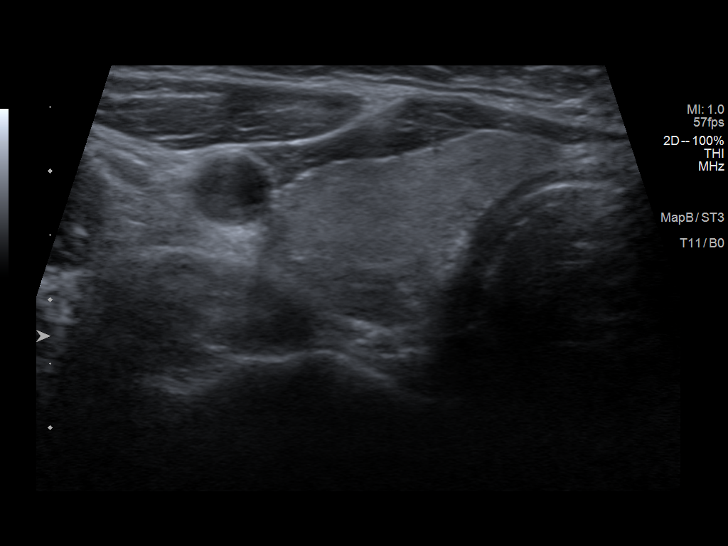
[im 71/106]
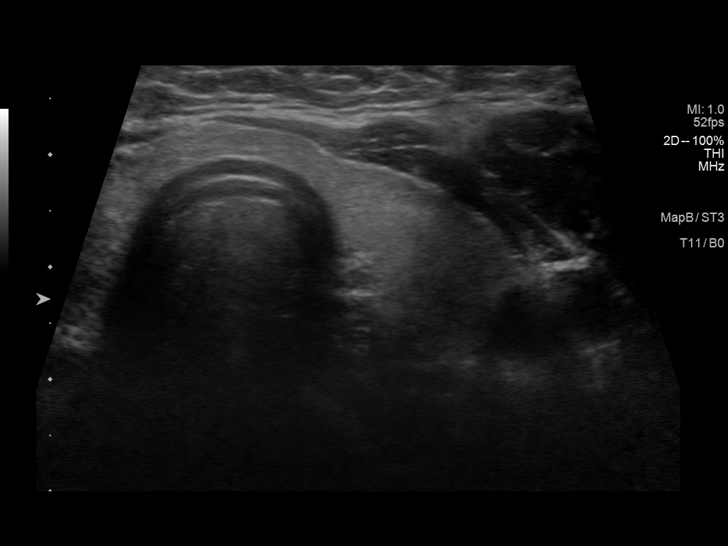
[im 79/106]
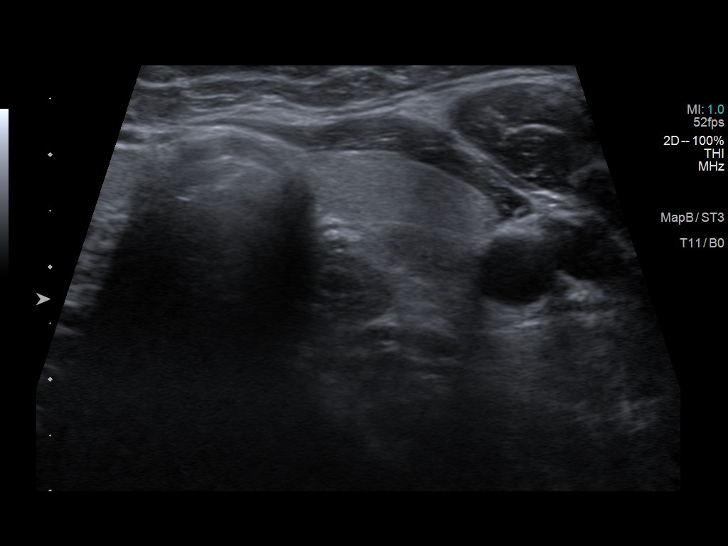
[im 88/106]
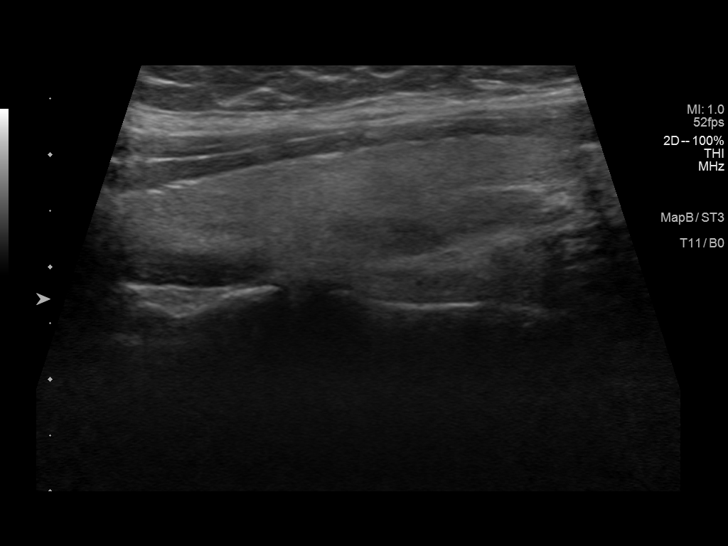
[im 97/106]
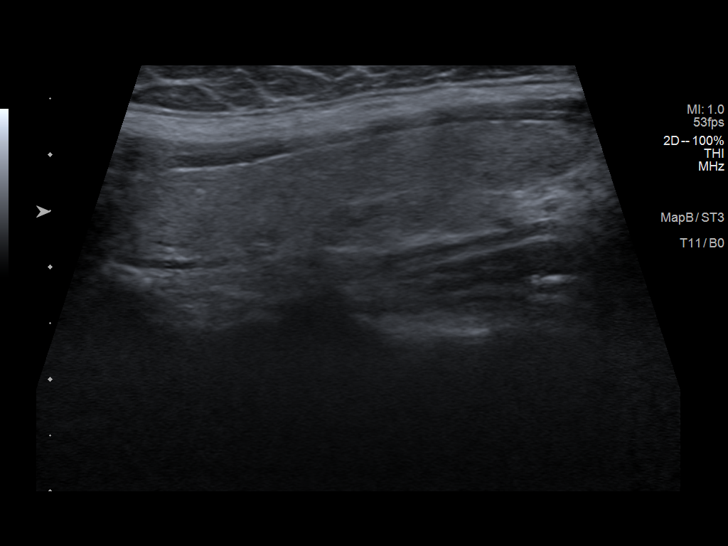
[im 106/106]
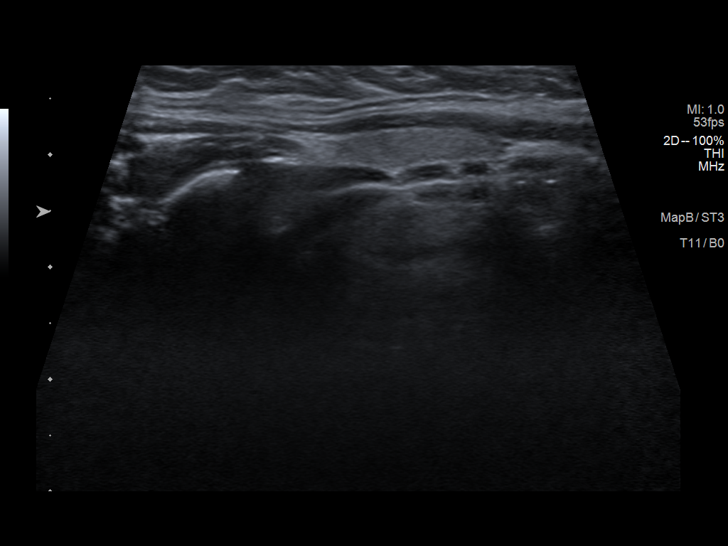

[13 of 25 positions shown; findings below may reference images not displayed]

FINDINGS: Parenchymal Echotexture: Mildly heterogenous

Isthmus: 4 mm

Right lobe: 4.4 x 1.4 x 1.5 cm

Left lobe: 4.6 x 1.1 x 1.7 cm

_________________________________________________________

Estimated total number of nodules >/= 1 cm: 1

Number of spongiform nodules >/=  2 cm not described below (TR1): 0

Number of mixed cystic and solid nodules >/= 1.5 cm not described
below (TR2): 0

_________________________________________________________

Nodule # 1:

Location: Right; Inferior

Maximum size: 2.1 cm; Other 2 dimensions: 0.7 x 0.5 cm

Composition: solid/almost completely solid (2)

Echogenicity: hypoechoic (2)

Shape: not taller-than-wide (0)

Margins: ill-defined (0)

Echogenic foci: none (0)

ACR TI-RADS total points: 4.

ACR TI-RADS risk category: TR4 (4-6 points).

ACR TI-RADS recommendations:

**Given size (>/= 1.5 cm) and appearance, fine needle aspiration of
this moderately suspicious nodule should be considered based on
TI-RADS criteria.

_________________________________________________________

Mild gland heterogeneity. No hypervascularity. No regional
adenopathy. No other significant thyroid abnormality.
IMPRESSION: 2.1 cm right inferior TR 4 nodule by ultrasound. This is very
ill-defined and may represent a pseudo nodule. The nodule does meet
criteria for biopsy as above. At the time of biopsy, the nodule can
be re-evaluated to assess for a true nodule versus a pseudo nodule.

No other significant thyroid abnormality.

The above is in keeping with the ACR TI-RADS recommendations - [HOSPITAL] 1352;[DATE].

## 2020-06-05 IMAGING — US SOFT TISSUE ULTRASOUND HEAD/NECK
1 series · 9 of 9 positions shown · non-contrast
Comparison: Thyroid ultrasound 08/11/2018

CLINICAL DATA: Possible RIGHT thyroid nodule, assessment for biopsy

EXAM:
ULTRASOUND OF HEAD/NECK SOFT TISSUES
TECHNIQUE: Ultrasound examination of the head and neck soft tissues was
performed in the area of clinical concern.

[Series 1: soft tissue ultrasound head/neck · 0.07mm/px · 9 of 9 slices shown]
[im 1/9]
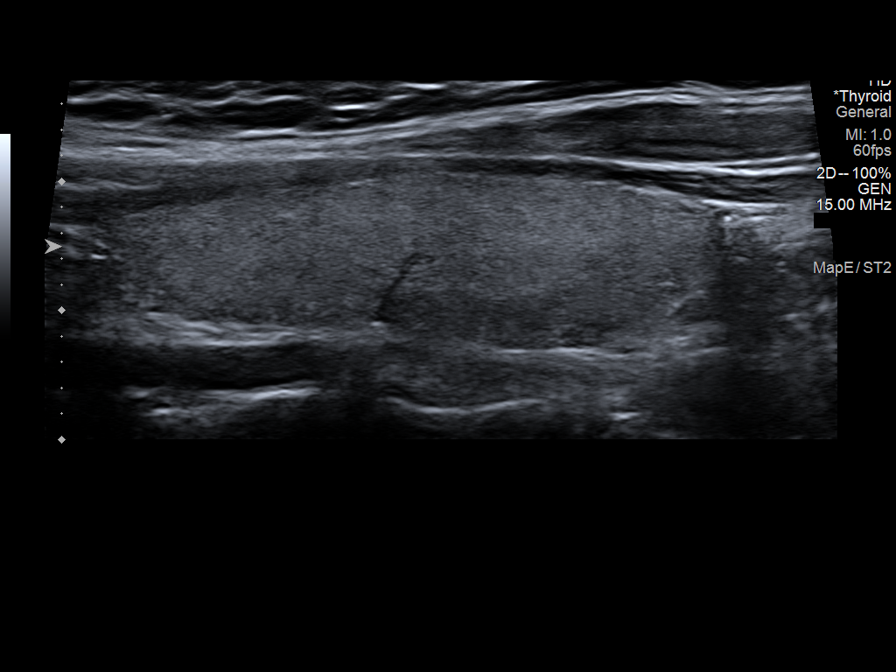
[im 2/9]
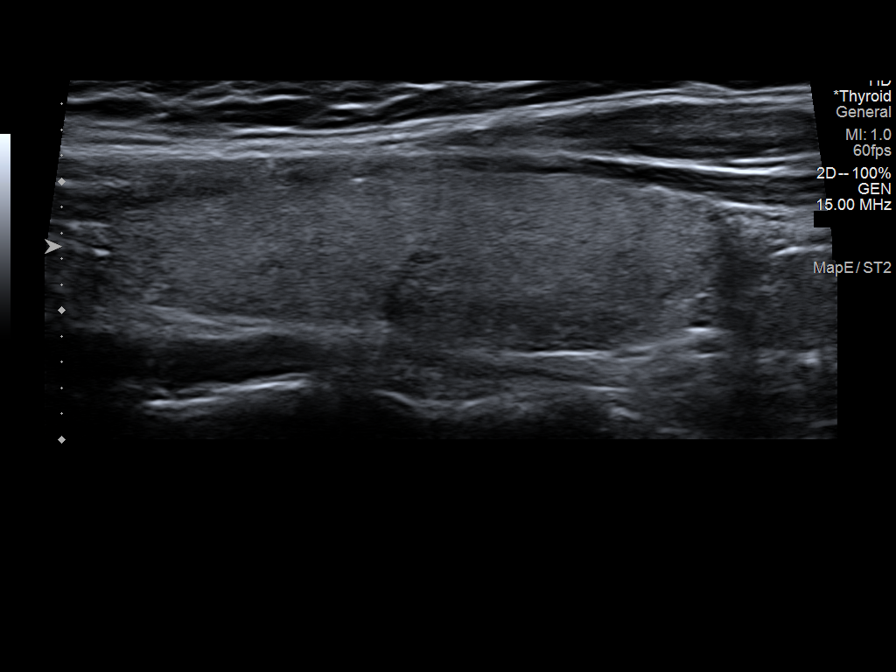
[im 3/9]
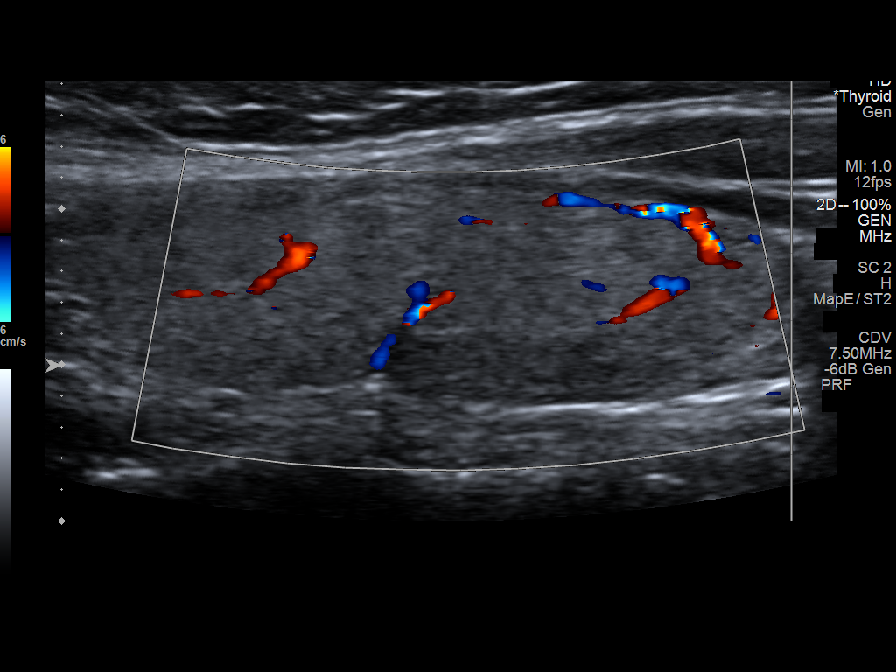
[im 4/9]
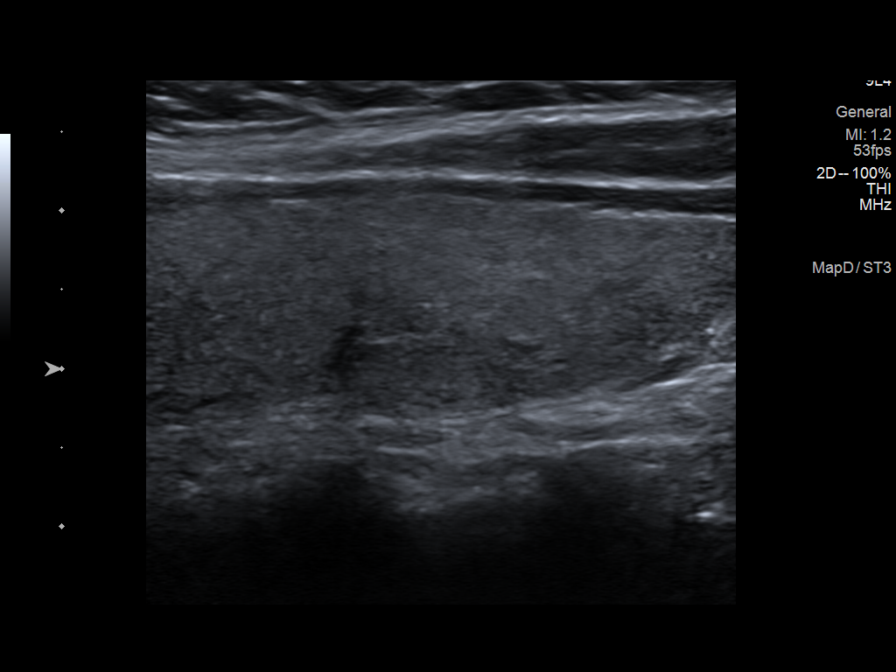
[im 5/9]
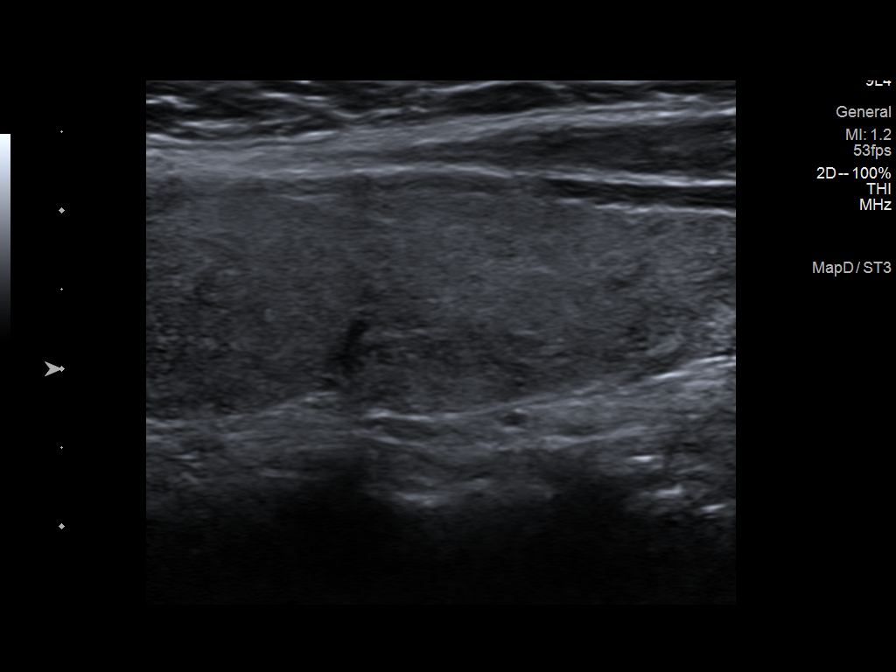
[im 6/9]
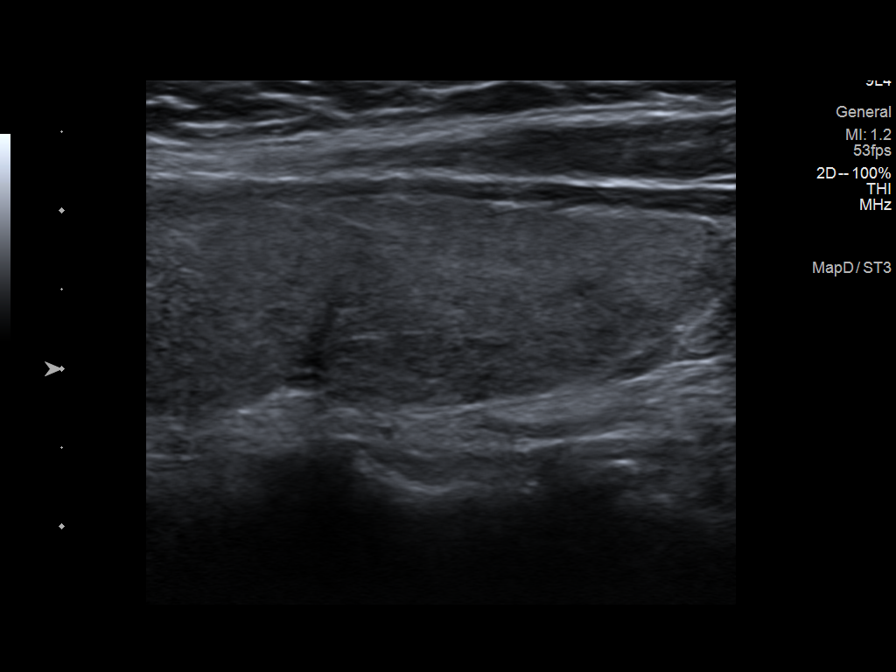
[im 7/9]
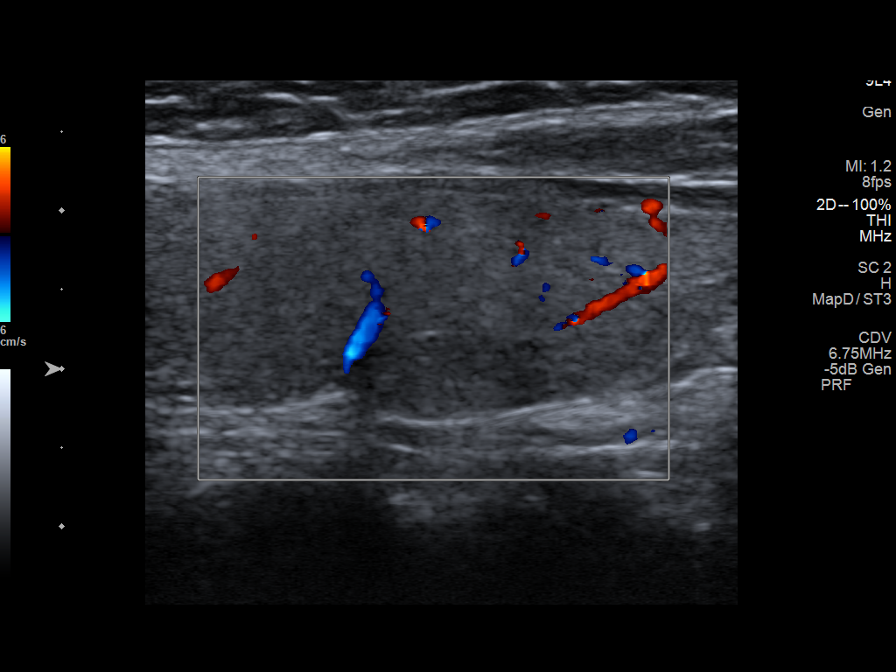
[im 8/9]
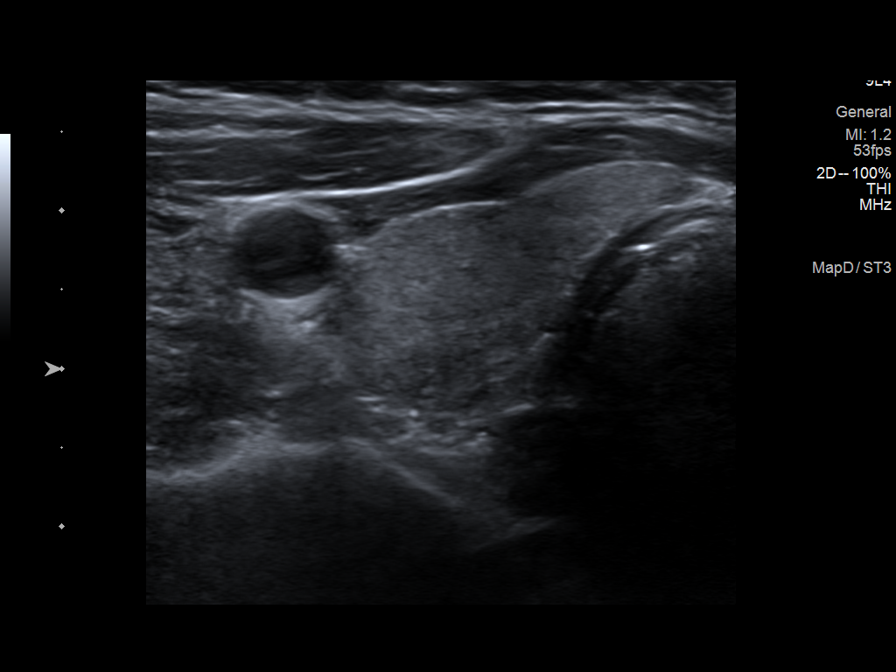
[im 9/9]
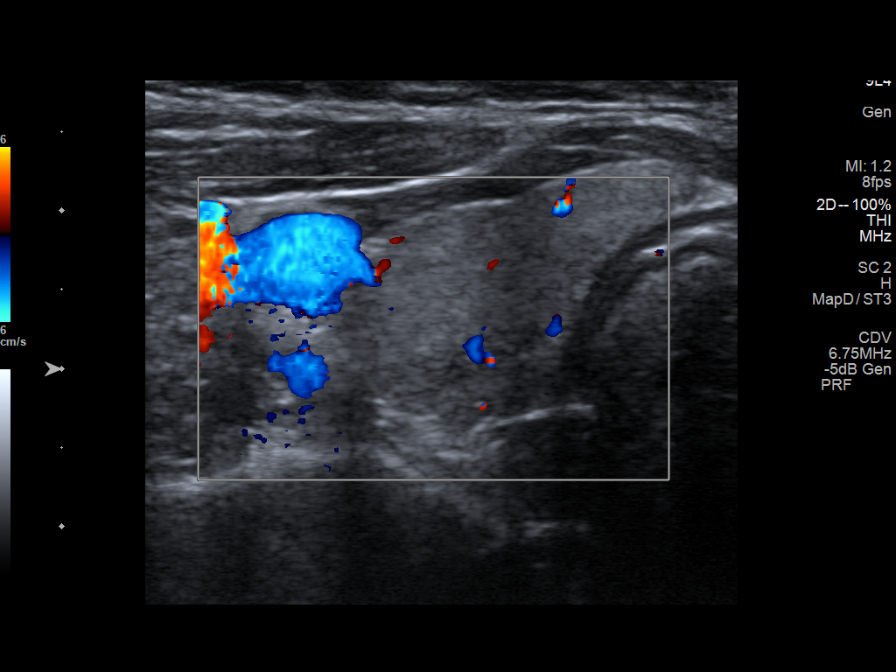

[9 of 9 positions shown; findings below may reference images not displayed]

FINDINGS: Sonography of the previously identified RIGHT lobe thyroid lesion
was performed in anticipation of biopsy. The area of the questioned
RIGHT thyroid nodule is inhomogeneous, slightly hypoechoic versus
remaining parenchyma, thin elongated and poorly defined. The nodule
was imaged at different frequencies and with different transducers.
This does not appear to represent a discrete thyroid nodule. Thyroid
biopsy is not performed at this time. Discussed with patient. A
six-month follow-up thyroid ultrasound is recommended to confirm
stability of this site.
IMPRESSION: The questioned thyroid nodule at the posterior aspect of the RIGHT
lobe appears to be inhomogeneous thyroid tissue rather than a
discrete thyroid nodule.

No biopsy performed at this time; six-month follow-up thyroid
ultrasound recommended to confirm stability of site.

## 2020-07-12 ENCOUNTER — Other Ambulatory Visit: Payer: Self-pay | Admitting: Psychiatry

## 2020-07-12 DIAGNOSIS — G4700 Insomnia, unspecified: Secondary | ICD-10-CM

## 2020-07-17 ENCOUNTER — Other Ambulatory Visit: Payer: Self-pay | Admitting: Psychiatry

## 2020-07-17 DIAGNOSIS — F411 Generalized anxiety disorder: Secondary | ICD-10-CM

## 2020-07-25 ENCOUNTER — Other Ambulatory Visit: Payer: Self-pay

## 2020-07-25 ENCOUNTER — Ambulatory Visit (INDEPENDENT_AMBULATORY_CARE_PROVIDER_SITE_OTHER): Payer: BC Managed Care – PPO | Admitting: Psychiatry

## 2020-07-25 ENCOUNTER — Encounter: Payer: Self-pay | Admitting: Psychiatry

## 2020-07-25 DIAGNOSIS — G4700 Insomnia, unspecified: Secondary | ICD-10-CM | POA: Diagnosis not present

## 2020-07-25 DIAGNOSIS — F411 Generalized anxiety disorder: Secondary | ICD-10-CM

## 2020-07-25 MED ORDER — TRAZODONE HCL 100 MG PO TABS: ORAL_TABLET | ORAL | 0 refills | Status: DC

## 2020-07-25 MED ORDER — SERTRALINE HCL 100 MG PO TABS: ORAL_TABLET | ORAL | 0 refills | Status: DC

## 2020-07-25 NOTE — Progress Notes (Signed)
Cathy Bailey [id number] 1995-04-02 25 y.o.  Subjective:   Patient ID:  Cathy Bailey is a 25 y.o. (DOB 12-Dec-1995) female.  Chief Complaint:  Chief Complaint  Patient presents with  . Anxiety  . Depression    HPI [name] presents to the office today for follow-up of anxiety, depression, and insomnia. Her grandfather died and 2 weeks later her step-father passed away. Grandfather fell on the stairs and developed a brain bleed. Step-father died suddenly from rare virus. Grandmother was placed in a memory care unit and then she got COVID and has been hospitalized several times. She and her boyfriend were dx'd with infertility issues. She is continuing to grieve. 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   She reports sad mood which is also grief related. She reports that work is triggering her anxiety. She had a panic attack on her way today after severe stressor at work and starting her period. She reports she has times when heart rate is increased with anxiety. She reports some worry upon awakening and when she is going to bed. She reports, "it's hard to leave work at work." She reports that she eats less with anxiety and that appetite is ok overall. She reports that she feels "exhausted all the time." Motivation is low. She reports that she has difficulty getting started on to-do list on Saturdays. Concentration has been ok. Denies SI.   She will be starting Letrozole to help with ovulation and fertility.   She reports that Trazodone was effective for her insomnia. She does not take it every night.   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 She will be taking 2 vacations this summer. Boyfriend works most weekdays from 7-5:30 pm.   Past Psychiatric Medication Trials: Zonisamide- Prescribed for headaches. Not effective. Topamax- Prescribed for headaches. Ineffective   PHQ2-9   Flowsheet Row Office Visit from 10/22/2016 in [facility] Office Visit from 04/02/2016 in [facility]  PHQ-2 Total Score 0 0       Review of Systems:  Review of Systems  Gastrointestinal: Negative.   Musculoskeletal: Negative for gait problem.  Neurological: Positive for headaches. Negative for tremors.       Has a headache every few days at different times. She reports that she has not been able to identify a pattern to headaches.   Psychiatric/Behavioral:       Please refer to HPI    Medications: I have reviewed the patient's current medications.  Current Outpatient Medications  Medication Sig Dispense Refill  . Ferrous Sulfate (IRON PO) Take by mouth.    . letrozole (FEMARA) 2.5 MG tablet letrozole 2.5 mg tablet  TAKE 1 TABLET BY MOUTH EVERY DAY FOR 5 DAYS ON CYCLE DAYS 3-7    . naproxen sodium (ALEVE) 220 MG tablet Take 220 mg by mouth.    . Prenatal Vit-Fe Fumarate-FA (PRENATAL PO) Take by mouth.    . sertraline (ZOLOFT) 100 MG tablet TAKE 1.5 TABLETS BY MOUTH DAILY 135 tablet 0  . traZODone (DESYREL) 100 MG tablet TAKE 1/2 TO 1 TABLET BY MOUTH AT BEDTIME AS NEEDED FOR INSOMNIA 90 tablet 0   No current facility-administered medications for this visit.    Medication Side Effects: Other: Mild sexual side effects  Allergies: No Known Allergies  Past Medical History:  Diagnosis Date  . Anemia   . Dysmenorrhea 07/05/2012  . Encounter for menstrual extraction 01/22/2014  . Irregular bleeding 05/24/2014  .  Irregular periods 04/03/2015  . Migraines   . Seasonal allergies     Past Medical History, Surgical history, Social history, and Family history were reviewed and updated as appropriate.   Please see review of systems for further details on the patient's review from today.   Objective:   Physical Exam:  There were no vitals taken for this visit.  Physical Exam Constitutional:      General: She is not in acute distress. Musculoskeletal:        General: No deformity.  Neurological:     Mental Status: She  is alert and oriented to person, place, and time.     Coordination: Coordination normal.  Psychiatric:        Attention and Perception: Attention and perception normal. She does not perceive auditory or visual hallucinations.        Mood and Affect: Affect is not labile, blunt, angry or inappropriate.        Speech: Speech normal.        Behavior: Behavior normal.        Thought Content: Thought content normal. Thought content is not paranoid or delusional. Thought content does not include homicidal or suicidal ideation. Thought content does not include homicidal or suicidal plan.        Cognition and Memory: Cognition and memory normal.        Judgment: Judgment normal.     Comments: Insight intact Mood is appropriate to content and affect is congruent, i.e. exhibits sad affect when discussing recent losses     Lab Review:     Component Value Date/Time   NA 138 08/30/2015 0101   K 3.9 08/30/2015 0101   CL 105 08/30/2015 0101   CO2 23 08/30/2015 0101   GLUCOSE 122 (H) 08/30/2015 0101   BUN 9 08/30/2015 0101   CREATININE 0.51 08/30/2015 0101   CALCIUM 9.3 08/30/2015 0101   GFRNONAA >60 08/30/2015 0101   GFRAA >60 08/30/2015 0101       Component Value Date/Time   WBC 15.4 (H) 08/30/2015 0101   RBC 4.73 08/30/2015 0101   HGB 13.5 08/30/2015 0101   HCT 42.3 08/30/2015 0101   PLT 307 08/30/2015 0101   MCV 89.4 08/30/2015 0101   MCH 28.5 08/30/2015 0101   MCHC 31.9 08/30/2015 0101   RDW 11.9 08/30/2015 0101   LYMPHSABS 1.4 08/30/2015 0101   MONOABS 1.0 08/30/2015 0101   EOSABS 0.0 08/30/2015 0101   BASOSABS 0.0 08/30/2015 0101    No results found for: POCLITH, LITHIUM   No results found for: PHENYTOIN, PHENOBARB, VALPROATE, CBMZ   .res Assessment: Plan:    Patient seen for 30 minutes and time spent counseling the patient regarding possible treatment options, to include potential benefits, risks, and side effects of increasing sertraline to 200 mg daily or continuing  150 mg daily.  Patient reports that she would like to continue current dose of sertraline 150 mg daily since she attributes recent anxiety and occasional sad mood to multiple losses and severe work stress and anticipates stressors resolving in the next 1 to 2 weeks when she leaves her current job.  Discussed that she could contact office if she decides that she would like to increase dose prior to next appointment. Continue trazodone 100 mg 1/2 to 1 tablet at bedtime as needed for insomnia. Patient to follow-up in 4 months or sooner if clinically indicated. Patient advised to contact office with any questions, adverse effects, or acute worsening in signs and symptoms.  Venda was seen today for anxiety and depression.  Diagnoses and all orders for this visit:  Generalized anxiety disorder -     sertraline (ZOLOFT) 100 MG tablet; TAKE 1.5 TABLETS BY MOUTH DAILY  Insomnia, unspecified type -     traZODone (DESYREL) 100 MG tablet; TAKE 1/2 TO 1 TABLET BY MOUTH AT BEDTIME AS NEEDED FOR INSOMNIA     Please see After Visit Summary for patient specific instructions.  Future Appointments  Date Time Provider Department Center  11/26/2020  4:00 PM Corie Chiquito, PMHNP [facility] None    No orders of the defined types were placed in this encounter.   -------------------------------

## 2020-09-11 ENCOUNTER — Other Ambulatory Visit: Payer: Self-pay

## 2020-09-11 ENCOUNTER — Ambulatory Visit
Admission: RE | Admit: 2020-09-11 | Discharge: 2020-09-11 | Disposition: A | Discharge status: Home / Self Care | Payer: BC Managed Care – PPO | Source: Ambulatory Visit | Attending: Emergency Medicine | Admitting: Emergency Medicine

## 2020-09-11 VITALS — BP 135/81 | HR 90 | Temp 99.6°F | Resp 18

## 2020-09-11 DIAGNOSIS — J069 Acute upper respiratory infection, unspecified: Secondary | ICD-10-CM | POA: Diagnosis not present

## 2020-09-11 MED ORDER — FLUTICASONE PROPIONATE 50 MCG/ACT NA SUSP: 1.0000 | Freq: Every day | NASAL | 0 refills | Status: DC

## 2020-09-11 MED ORDER — IBUPROFEN 800 MG PO TABS: 800.0000 mg | ORAL_TABLET | Freq: Three times a day (TID) | ORAL | 0 refills | Status: DC

## 2020-09-11 MED ORDER — GUAIFENESIN-CODEINE 100-10 MG/5ML PO SOLN: 5.0000 mL | Freq: Every evening | ORAL | 0 refills | Status: DC | PRN

## 2020-09-11 MED ORDER — PSEUDOEPH-BROMPHEN-DM 30-2-10 MG/5ML PO SYRP: 10.0000 mL | ORAL_SOLUTION | Freq: Four times a day (QID) | ORAL | 0 refills | Status: DC | PRN

## 2020-09-11 NOTE — Discharge Instructions (Addendum)
Continue to rest and drink plenty of fluids Tylenol and ibuprofen as needed for headache, body aches, fevers Flonase nasal spray 1 to 2 spray in each nostril daily May use brom-phed cough syrup provided for further relief of cough and congestion during the day Robitussin with codeine for bedtime-will cause drowsiness, do not drive or work after taking Please follow-up if any symptoms not improving or worsening

## 2020-09-11 NOTE — ED Triage Notes (Signed)
Patient presents to [facility] for evaluation of nasal congestion, sinus pressure and cough x 3 days.  States she has difficulty sleeping at night due to the nasal congestion.

## 2020-09-11 NOTE — ED Provider Notes (Signed)
[facility]    CSN: [id number] Arrival date & time: 09/11/20  1033      History   Chief Complaint Chief Complaint  Patient presents with   URI    HPI Cathy Bailey is a 25 y.o. female history of migraines presenting today for evaluation of URI symptoms.  Reports that over the past 4-5 days she has had congestion cough and sinus pressure.  Reports symptoms worse at nighttime and affecting sleep.  Overall feels symptoms are improving, the main concern has been nighttime congestion and headache.  Reports at home COVID test negative.  HPI  Past Medical History:  Diagnosis Date   Anemia    Dysmenorrhea 07/05/2012   Encounter for menstrual extraction 01/22/2014   Irregular bleeding 05/24/2014   Irregular periods 04/03/2015   Migraines    Seasonal allergies     Patient Active Problem List   Diagnosis Date Noted   Irregular periods 04/03/2015   Irregular bleeding 05/24/2014   Encounter for menstrual extraction 01/22/2014   Dysmenorrhea 07/05/2012    Past Surgical History:  Procedure Laterality Date   TONSILLECTOMY AND ADENOIDECTOMY     WISDOM TOOTH EXTRACTION      OB History     Gravida  0   Para  0   Term  0   Preterm  0   AB  0   Living  0      SAB  0   IAB  0   Ectopic  0   Multiple  0   Live Births               Home Medications    Prior to Admission medications   Medication Sig Start Date End Date Taking? Authorizing Provider  brompheniramine-pseudoephedrine-DM 30-2-10 MG/5ML syrup Take 10 mLs by mouth 4 (four) times daily as needed. 09/11/20  Yes Ariany Kesselman C, PA-C  Ferrous Sulfate (IRON PO) Take by mouth.   Yes [provider]  fluticasone (FLONASE) 50 MCG/ACT nasal spray Place 1-2 sprays into both nostrils daily. 09/11/20  Yes Marcello Tuzzolino C, PA-C  guaiFENesin-codeine 100-10 MG/5ML syrup Take 5-10 mLs by mouth at bedtime as needed for cough. 09/11/20  Yes Charron Coultas C, PA-C  ibuprofen (ADVIL) 800 MG  tablet Take 1 tablet (800 mg total) by mouth 3 (three) times daily. 09/11/20  Yes Alisan Dokes C, PA-C  letrozole (FEMARA) 2.5 MG tablet letrozole 2.5 mg tablet  TAKE 1 TABLET BY MOUTH EVERY DAY FOR 5 DAYS ON CYCLE DAYS 3-7    [provider]  naproxen sodium (ALEVE) 220 MG tablet Take 220 mg by mouth.    [provider]  Prenatal Vit-Fe Fumarate-FA (PRENATAL PO) Take by mouth.    [provider]  sertraline (ZOLOFT) 100 MG tablet TAKE 1.5 TABLETS BY MOUTH DAILY 07/25/20   Corie Chiquito, PMHNP  traZODone (DESYREL) 100 MG tablet TAKE 1/2 TO 1 TABLET BY MOUTH AT BEDTIME AS NEEDED FOR INSOMNIA 07/25/20   Corie Chiquito, PMHNP    Family History Family History  Problem Relation Age of Onset   Hypertension Father    Cancer Maternal Grandmother 81       breast   Hyperlipidemia Maternal Grandmother    Hypertension Maternal Grandfather    Macular degeneration Maternal Grandfather    Atrial fibrillation Maternal Grandfather    Congestive Heart Failure Maternal Grandfather    Thyroid disease Paternal Grandmother    Hypertension Paternal Grandmother    Atrial fibrillation Paternal Grandmother  Hypertension Paternal Grandfather     Social History Social History   Tobacco Use   Smoking status: Never   Smokeless tobacco: Never  Substance Use Topics   Alcohol use: Yes    Alcohol/week: 3.0 - 4.0 standard drinks    Types: 3 - 4 Glasses of wine per week   Drug use: No     Allergies   Patient has no known allergies.   Review of Systems Review of Systems  Constitutional:  Negative for activity change, appetite change, chills, fatigue and fever.  HENT:  Positive for congestion, rhinorrhea and sinus pressure. Negative for ear pain, sore throat and trouble swallowing.   Eyes:  Negative for discharge and redness.  Respiratory:  Positive for cough. Negative for chest tightness and shortness of breath.   Cardiovascular:  Negative for chest pain.   Gastrointestinal:  Negative for abdominal pain, diarrhea, nausea and vomiting.  Musculoskeletal:  Negative for myalgias.  Skin:  Negative for rash.  Neurological:  Negative for dizziness, light-headedness and headaches.    Physical Exam Triage Vital Signs ED Triage Vitals [09/11/20 1045]  Enc Vitals Group     BP 135/81     Pulse Rate 90     Resp 18     Temp 99.6 F (37.6 C)     Temp Source Oral     SpO2 96 %     Weight      Height      Head Circumference      Peak Flow      Pain Score 6     Pain Loc      Pain Edu?      Excl. in GC?    No data found.  Updated Vital Signs BP 135/81 (BP Location: Left Arm)   Pulse 90   Temp 99.6 F (37.6 C) (Oral)   Resp 18   LMP 08/21/2020 (Exact Date)   SpO2 96%   Visual Acuity Right Eye Distance:   Left Eye Distance:   Bilateral Distance:    Right Eye Near:   Left Eye Near:    Bilateral Near:     Physical Exam Vitals and nursing note reviewed.  Constitutional:      Appearance: She is well-developed.     Comments: No acute distress  HENT:     Head: Normocephalic and atraumatic.     Ears:     Comments: Bilateral ears without tenderness to palpation of external auricle, tragus and mastoid, EAC's without erythema or swelling, TM's with good bony landmarks and cone of light. Non erythematous.      Nose: Nose normal.     Mouth/Throat:     Comments: Oral mucosa pink and moist, no tonsillar enlargement or exudate. Posterior pharynx patent and nonerythematous, no uvula deviation or swelling. Normal phonation.  Eyes:     Conjunctiva/sclera: Conjunctivae normal.  Cardiovascular:     Rate and Rhythm: Normal rate.  Pulmonary:     Effort: Pulmonary effort is normal. No respiratory distress.     Comments: Breathing comfortably at rest, CTABL, no wheezing, rales or other adventitious sounds auscultated  Abdominal:     General: There is no distension.  Musculoskeletal:        General: Normal range of motion.     Cervical  back: Neck supple.  Skin:    General: Skin is warm and dry.  Neurological:     Mental Status: She is alert and oriented to person, place, and time.  UC Treatments / Results  Labs (all labs ordered are listed, but only abnormal results are displayed) Labs Reviewed - No data to display  EKG   Radiology No results found.  Procedures Procedures (including critical care time)  Medications Ordered in UC Medications - No data to display  Initial Impression / Assessment and Plan / UC Course  I have reviewed the triage vital signs and the nursing notes.  Pertinent labs & imaging results that were available during my care of the patient were reviewed by me and considered in my medical decision making (see chart for details).     Viral URI with cough-at home COVID-negative, exam reassuring, recommending symptomatic and supportive care, providing Flonase, daytime and nighttime cough syrup, continue rest and fluids. Discussed strict return precautions. Patient verbalized understanding and is agreeable with plan.  Final Clinical Impressions(s) / UC Diagnoses   Final diagnoses:  Viral URI with cough     Discharge Instructions      Continue to rest and drink plenty of fluids Tylenol and ibuprofen as needed for headache, body aches, fevers Flonase nasal spray 1 to 2 spray in each nostril daily May use brom-phed cough syrup provided for further relief of cough and congestion during the day Robitussin with codeine for bedtime-will cause drowsiness, do not drive or work after taking Please follow-up if any symptoms not improving or worsening     ED Prescriptions     Medication Sig Dispense Auth. Provider   fluticasone (FLONASE) 50 MCG/ACT nasal spray Place 1-2 sprays into both nostrils daily. 16 g Jarissa Sheriff C, PA-C   brompheniramine-pseudoephedrine-DM 30-2-10 MG/5ML syrup Take 10 mLs by mouth 4 (four) times daily as needed. 120 mL Michell Kader C, PA-C    guaiFENesin-codeine 100-10 MG/5ML syrup Take 5-10 mLs by mouth at bedtime as needed for cough. 120 mL Leanard Dimaio C, PA-C   ibuprofen (ADVIL) 800 MG tablet Take 1 tablet (800 mg total) by mouth 3 (three) times daily. 21 tablet Zedric Deroy, Sherwood C, PA-C      I have reviewed the PDMP during this encounter.   Lew Dawes, PA-C 09/11/20 1121

## 2020-11-19 LAB — HEPATITIS C ANTIBODY: HCV Ab: NEGATIVE

## 2020-11-19 LAB — OB RESULTS CONSOLE ABO/RH: RH Type: NEGATIVE

## 2020-11-19 LAB — OB RESULTS CONSOLE HIV ANTIBODY (ROUTINE TESTING): HIV: NONREACTIVE

## 2020-11-19 LAB — OB RESULTS CONSOLE HEPATITIS B SURFACE ANTIGEN: Hepatitis B Surface Ag: NEGATIVE

## 2020-11-19 LAB — OB RESULTS CONSOLE PLATELET COUNT: Platelets: 357

## 2020-11-19 LAB — OB RESULTS CONSOLE HGB/HCT, BLOOD
HCT: 40 (ref 29–41)
Hemoglobin: 13.2

## 2020-11-19 LAB — OB RESULTS CONSOLE ANTIBODY SCREEN: Antibody Screen: NEGATIVE

## 2020-11-19 LAB — OB RESULTS CONSOLE RPR: RPR: NONREACTIVE

## 2020-11-19 LAB — OB RESULTS CONSOLE RUBELLA ANTIBODY, IGM: Rubella: IMMUNE

## 2020-11-26 ENCOUNTER — Ambulatory Visit: Payer: BC Managed Care – PPO | Admitting: Psychiatry

## 2020-12-04 LAB — OB RESULTS CONSOLE GC/CHLAMYDIA
Chlamydia: NEGATIVE
Gonorrhea: NEGATIVE

## 2020-12-09 ENCOUNTER — Other Ambulatory Visit: Payer: Self-pay

## 2020-12-09 ENCOUNTER — Inpatient Hospital Stay (EMERGENCY_DEPARTMENT_HOSPITAL)
Admission: AD | Admit: 2020-12-09 | Discharge: 2020-12-10 | Disposition: A | Discharge status: Home / Self Care | Payer: BC Managed Care – PPO | Source: Home / Self Care | Attending: Obstetrics and Gynecology | Admitting: Obstetrics and Gynecology

## 2020-12-09 ENCOUNTER — Encounter (HOSPITAL_COMMUNITY): Payer: Self-pay | Admitting: Obstetrics and Gynecology

## 2020-12-09 ENCOUNTER — Inpatient Hospital Stay (HOSPITAL_COMMUNITY)
Admission: AD | Admit: 2020-12-09 | Discharge: 2020-12-09 | Disposition: A | Discharge status: Home / Self Care | Payer: BC Managed Care – PPO | Attending: Obstetrics and Gynecology | Admitting: Obstetrics and Gynecology

## 2020-12-09 DIAGNOSIS — K529 Noninfective gastroenteritis and colitis, unspecified: Secondary | ICD-10-CM

## 2020-12-09 DIAGNOSIS — Z3A12 12 weeks gestation of pregnancy: Secondary | ICD-10-CM

## 2020-12-09 DIAGNOSIS — O210 Mild hyperemesis gravidarum: Secondary | ICD-10-CM | POA: Insufficient documentation

## 2020-12-09 DIAGNOSIS — O99611 Diseases of the digestive system complicating pregnancy, first trimester: Secondary | ICD-10-CM | POA: Insufficient documentation

## 2020-12-09 DIAGNOSIS — Z3491 Encounter for supervision of normal pregnancy, unspecified, first trimester: Secondary | ICD-10-CM

## 2020-12-09 DIAGNOSIS — O219 Vomiting of pregnancy, unspecified: Secondary | ICD-10-CM

## 2020-12-09 HISTORY — DX: Anxiety disorder, unspecified: F41.9

## 2020-12-09 LAB — COMPREHENSIVE METABOLIC PANEL
ALT: 18 U/L (ref 0–44)
AST: 17 U/L (ref 15–41)
Albumin: 3.7 g/dL (ref 3.5–5.0)
Alkaline Phosphatase: 60 U/L (ref 38–126)
Anion gap: 10 (ref 5–15)
BUN: 6 mg/dL (ref 6–20)
CO2: 22 mmol/L (ref 22–32)
Calcium: 9.4 mg/dL (ref 8.9–10.3)
Chloride: 102 mmol/L (ref 98–111)
Creatinine, Ser: 0.51 mg/dL (ref 0.44–1.00)
GFR, Estimated: >60 mL/min (ref >60)
Glucose, Bld: 78 mg/dL (ref 70–99)
Potassium: 3.8 mmol/L (ref 3.5–5.1)
Sodium: 134 mmol/L — ABNORMAL LOW (ref 135–145)
Total Bilirubin: 0.6 mg/dL (ref 0.3–1.2)
Total Protein: 7.4 g/dL (ref 6.5–8.1)

## 2020-12-09 LAB — CBC WITH DIFFERENTIAL/PLATELET
Abs Immature Granulocytes: 0.1 10*3/uL — ABNORMAL HIGH (ref 0.00–0.07)
Basophils Absolute: 0.1 10*3/uL (ref 0.0–0.1)
Basophils Relative: 0 %
Eosinophils Absolute: 0.1 10*3/uL (ref 0.0–0.5)
Eosinophils Relative: 1 %
HCT: 41.8 % (ref 36.0–46.0)
Hemoglobin: 13.6 g/dL (ref 12.0–15.0)
Immature Granulocytes: 1 %
Lymphocytes Relative: 18 %
Lymphs Abs: 2.6 10*3/uL (ref 0.7–4.0)
MCH: 28 pg (ref 26.0–34.0)
MCHC: 32.5 g/dL (ref 30.0–36.0)
MCV: 86 fL (ref 80.0–100.0)
Monocytes Absolute: 0.9 10*3/uL (ref 0.1–1.0)
Monocytes Relative: 6 %
Neutro Abs: 10.8 10*3/uL — ABNORMAL HIGH (ref 1.7–7.7)
Neutrophils Relative %: 74 %
Platelets: 352 10*3/uL (ref 150–400)
RBC: 4.86 MIL/uL (ref 3.87–5.11)
RDW: 13.1 % (ref 11.5–15.5)
WBC: 14.6 10*3/uL — ABNORMAL HIGH (ref 4.0–10.5)
nRBC: 0 % (ref 0.0–0.2)

## 2020-12-09 LAB — URINALYSIS, ROUTINE W REFLEX MICROSCOPIC
Bilirubin Urine: NEGATIVE
Bilirubin Urine: NEGATIVE
Glucose, UA: NEGATIVE mg/dL
Glucose, UA: NEGATIVE mg/dL
Hgb urine dipstick: NEGATIVE
Hgb urine dipstick: NEGATIVE
Ketones, ur: NEGATIVE mg/dL
Ketones, ur: 80 mg/dL — AB
Leukocytes,Ua: NEGATIVE
Leukocytes,Ua: NEGATIVE
Nitrite: NEGATIVE
Nitrite: NEGATIVE
Protein, ur: 30 mg/dL — AB
Protein, ur: NEGATIVE mg/dL
Specific Gravity, Urine: 1.026 (ref 1.005–1.030)
Specific Gravity, Urine: 1.026 (ref 1.005–1.030)
pH: 5 (ref 5.0–8.0)
pH: 6 (ref 5.0–8.0)

## 2020-12-09 LAB — LIPASE, BLOOD: Lipase: 25 U/L (ref 11–51)

## 2020-12-09 MED ORDER — ONDANSETRON HCL 4 MG/2ML IJ SOLN
4.0000 mg | Freq: Once | INTRAMUSCULAR | Status: AC
  Administered 2020-12-09: 4 mg via INTRAVENOUS
  Filled 2020-12-09: qty 2

## 2020-12-09 MED ORDER — ONDANSETRON 4 MG PO TBDP
4.0000 mg | ORAL_TABLET | Freq: Once | ORAL | Status: AC
  Administered 2020-12-09: 4 mg via ORAL
  Filled 2020-12-09: qty 1

## 2020-12-09 MED ORDER — LACTATED RINGERS IV BOLUS
1000.0000 mL | Freq: Once | INTRAVENOUS | Status: AC
  Administered 2020-12-09: 1000 mL via INTRAVENOUS

## 2020-12-09 MED ORDER — ONDANSETRON 4 MG PO TBDP: 4.0000 mg | ORAL_TABLET | Freq: Four times a day (QID) | ORAL | 0 refills | Status: DC | PRN

## 2020-12-09 MED ORDER — SODIUM CHLORIDE 0.9 % IV SOLN
25.0000 mg | Freq: Once | INTRAVENOUS | Status: AC
  Administered 2020-12-09: 25 mg via INTRAVENOUS
  Filled 2020-12-09: qty 1

## 2020-12-09 NOTE — MAU Note (Signed)
..  Cathy Bailey is a 25 y.o. at [redacted]w[redacted]d here in MAU reporting: nausea and vomiting since this morning, has vomited 6 times in the past 24 hours. After she went home, rested and woke up she attempted to eat saltines and gatorade she threw them up.  Called the nurses line and was told she could not take zofran and her phenergan together and she should come in to see if she was dehydrated.  Reports some lower abdominal pain that comes and goes. Denies vaginal bleeding.  Pain score: 3/10  Vitals:   12/09/20 1939  BP: 129/78  Pulse: 89  Resp: 20  Temp: 98.4 F (36.9 C)  SpO2: 98%     FHT:160 Lab orders placed from triage: UA

## 2020-12-09 NOTE — MAU Note (Signed)
Presents stating she's having N/V and diarrhea since 0430 this morning.  Reports has had 4 diarrhea stools this morning. States she has Phenergan prescribed and took it at 0600.  Denies VB.

## 2020-12-09 NOTE — MAU Note (Signed)
Pt reports nausea 90% improved - resting with eyes closed. LR bolus infused.

## 2020-12-09 NOTE — MAU Provider Note (Signed)
History     CSN: [id number]  Arrival date and time: 12/09/20 0756   Event Date/Time   First Provider Initiated Contact with Patient 12/09/20 [phone]      Chief Complaint  Patient presents with   Emesis   Nausea   Diarrhea   HPI  This is a 25 yo G1P0 at [redacted]w[redacted]d. She presents with n/v since she woke up this morning at 6:30. She took phenergan, tried to eat, then vomited.  Emesis described as stomach contents.  She has not vomited since approximately 730, although feels quite nauseated and on the verge of vomiting.  No palliating or provoking factors.  She does have loose stools, but no fevers, chills, abdominal pain.  OB History     Gravida  1   Para  0   Term  0   Preterm  0   AB  0   Living  0      SAB  0   IAB  0   Ectopic  0   Multiple  0   Live Births              Past Medical History:  Diagnosis Date   Anemia    Dysmenorrhea 07/05/2012   Encounter for menstrual extraction 01/22/2014   Irregular bleeding 05/24/2014   Irregular periods 04/03/2015   Migraines    Seasonal allergies     Past Surgical History:  Procedure Laterality Date   TONSILLECTOMY AND ADENOIDECTOMY     WISDOM TOOTH EXTRACTION      Family History  Problem Relation Age of Onset   Hypertension Father    Cancer Maternal Grandmother 66       breast   Hyperlipidemia Maternal Grandmother    Hypertension Maternal Grandfather    Macular degeneration Maternal Grandfather    Atrial fibrillation Maternal Grandfather    Congestive Heart Failure Maternal Grandfather    Thyroid disease Paternal Grandmother    Hypertension Paternal Grandmother    Atrial fibrillation Paternal Grandmother    Hypertension Paternal Grandfather     Social History   Tobacco Use   Smoking status: Never   Smokeless tobacco: Never  Substance Use Topics   Alcohol use: Yes    Alcohol/week: 3.0 - 4.0 standard drinks    Types: 3 - 4 Glasses of wine per week   Drug use: No    Allergies: No Known  Allergies  Medications Prior to Admission  Medication Sig Dispense Refill Last Dose   brompheniramine-pseudoephedrine-DM 30-2-10 MG/5ML syrup Take 10 mLs by mouth 4 (four) times daily as needed. 120 mL 0    Ferrous Sulfate (IRON PO) Take by mouth.      fluticasone (FLONASE) 50 MCG/ACT nasal spray Place 1-2 sprays into both nostrils daily. 16 g 0    guaiFENesin-codeine 100-10 MG/5ML syrup Take 5-10 mLs by mouth at bedtime as needed for cough. 120 mL 0    ibuprofen (ADVIL) 800 MG tablet Take 1 tablet (800 mg total) by mouth 3 (three) times daily. 21 tablet 0    letrozole (FEMARA) 2.5 MG tablet letrozole 2.5 mg tablet  TAKE 1 TABLET BY MOUTH EVERY DAY FOR 5 DAYS ON CYCLE DAYS 3-7      naproxen sodium (ALEVE) 220 MG tablet Take 220 mg by mouth.      Prenatal Vit-Fe Fumarate-FA (PRENATAL PO) Take by mouth.      sertraline (ZOLOFT) 100 MG tablet TAKE 1.5 TABLETS BY MOUTH DAILY 135 tablet 0    traZODone (DESYREL) 100  MG tablet TAKE 1/2 TO 1 TABLET BY MOUTH AT BEDTIME AS NEEDED FOR INSOMNIA 90 tablet 0     Review of Systems Physical Exam   Blood pressure 130/70, pulse 90, temperature 97.7 F (36.5 C), temperature source Oral, resp. rate 20, height 5\' 8"  (1.727 m), weight 97.1 kg, last menstrual period 08/21/2020, SpO2 97 %.  Physical Exam Vitals reviewed.  Constitutional:      Appearance: Normal appearance.  HENT:     Head: Normocephalic and atraumatic.  Pulmonary:     Effort: Pulmonary effort is normal.  Abdominal:     General: Abdomen is flat.     Palpations: Abdomen is soft.  Skin:    General: Skin is warm and dry.     Capillary Refill: Capillary refill takes less than 2 seconds.  Neurological:     General: No focal deficit present.     Mental Status: She is alert.  Psychiatric:        Mood and Affect: Mood normal.        Behavior: Behavior normal.        Thought Content: Thought content normal.        Judgment: Judgment normal.   Results for orders placed or performed during  the hospital encounter of 12/09/20 (from the past 24 hour(s))  Urinalysis, Routine w reflex microscopic Urine, Clean Catch     Status: Abnormal   Collection Time: 12/09/20  8:19 AM  Result Value Ref Range   Color, Urine YELLOW YELLOW   APPearance HAZY (A) CLEAR   Specific Gravity, Urine 1.026 1.005 - 1.030   pH 5.0 5.0 - 8.0   Glucose, UA NEGATIVE NEGATIVE mg/dL   Hgb urine dipstick NEGATIVE NEGATIVE   Bilirubin Urine NEGATIVE NEGATIVE   Ketones, ur NEGATIVE NEGATIVE mg/dL   Protein, ur 30 (A) NEGATIVE mg/dL   Nitrite NEGATIVE NEGATIVE   Leukocytes,Ua NEGATIVE NEGATIVE   RBC / HPF 0-5 0 - 5 RBC/hpf   WBC, UA 0-5 0 - 5 WBC/hpf   Bacteria, UA MANY (A) NONE SEEN   Squamous Epithelial / LPF 6-10 0 - 5   Mucus PRESENT      MAU Course  Procedures  MDM Oral zofran given. Patient tolerating oral liquids.  Assessment and Plan   1. [redacted] weeks gestation of pregnancy   2. Vomiting or nausea of pregnancy    Discharge with PO zofran. Imodium as needed.  02/08/21 12/09/2020, 9:06 AM

## 2020-12-09 NOTE — MAU Provider Note (Signed)
Chief Complaint:  Nausea and Emesis During Pregnancy  HPI: Cathy Bailey is a 25 y.o. G1P0000 at [redacted]w[redacted]d who presents to maternity admissions reporting continued nausea/vomiting/diarrhea since she was seen in MAU earlier today for the same issue. Has kept down some fluids, but cannot keep down solids. Has phenergan and was given a prescription for zofran, the triage RN told her not to take both at the same time. Normally has some nausea in the morning which resolves with meds and food throughout the day. This diarrhea with n/v is new today. Denies vaginal bleeding, leaking of fluid, decreased fetal movement, fever, falls, or other recent illness.   Pregnancy Course: Uncomplicated so far, receives care at [facility]  Past Medical History:  Diagnosis Date   Anemia    Anxiety    Dysmenorrhea 07/05/2012   Encounter for menstrual extraction 01/22/2014   Irregular bleeding 05/24/2014   Irregular periods 04/03/2015   Migraines    Seasonal allergies    OB History  Gravida Para Term Preterm AB Living  1 0 0 0 0 0  SAB IAB Ectopic Multiple Live Births  0 0 0 0      # Outcome Date GA Lbr Len/2nd Weight Sex Delivery Anes PTL Lv  1 Current            Past Surgical History:  Procedure Laterality Date   TONSILLECTOMY AND ADENOIDECTOMY     WISDOM TOOTH EXTRACTION     Family History  Problem Relation Age of Onset   Hypertension Father    Cancer Maternal Grandmother 50       breast   Hyperlipidemia Maternal Grandmother    Hypertension Maternal Grandfather    Macular degeneration Maternal Grandfather    Atrial fibrillation Maternal Grandfather    Congestive Heart Failure Maternal Grandfather    Thyroid disease Paternal Grandmother    Hypertension Paternal Grandmother    Atrial fibrillation Paternal Grandmother    Hypertension Paternal Grandfather    Social History   Tobacco Use   Smoking status: Never   Smokeless tobacco: Never  Vaping Use   Vaping Use: Never  used  Substance Use Topics   Alcohol use: Yes    Alcohol/week: 3.0 - 4.0 standard drinks    Types: 3 - 4 Glasses of wine per week   Drug use: No   No Known Allergies Medications Prior to Admission  Medication Sig Dispense Refill Last Dose   Prenatal Vit-Fe Fumarate-FA (PRENATAL PO) Take by mouth.   12/09/2020   sertraline (ZOLOFT) 100 MG tablet TAKE 1.5 TABLETS BY MOUTH DAILY 135 tablet 0 12/08/2020   Ferrous Sulfate (IRON PO) Take by mouth.      ondansetron (ZOFRAN ODT) 4 MG disintegrating tablet Take 1 tablet (4 mg total) by mouth every 6 (six) hours as needed for nausea. 20 tablet 0    traZODone (DESYREL) 100 MG tablet TAKE 1/2 TO 1 TABLET BY MOUTH AT BEDTIME AS NEEDED FOR INSOMNIA 90 tablet 0    I have reviewed patient's Past Medical Hx, Surgical Hx, Family Hx, Social Hx, medications and allergies.   ROS:  Pertinent items noted in HPI and remainder of comprehensive ROS otherwise negative.   Physical Exam  Patient Vitals for the past 24 hrs:  BP Temp Temp src Pulse Resp SpO2 Height Weight  12/09/20 1939 129/78 98.4 F (36.9 C) Oral 89 20 98 % 5\' 8"  (1.727 m) 211 lb 14.4 oz (96.1 kg)   Constitutional: Well-developed, well-nourished female in no acute  distress.  Cardiovascular: normal rate & rhythm, no murmur Respiratory: normal effort, lung sounds clear throughout GI: Abd soft, non-tender, gravid appropriate for gestational age. Pos BS x 4 MS: Extremities nontender, no edema, normal ROM Neurologic: Alert and oriented x 4.  GU: no CVA tenderness Pelvic: exam deferred  FHR: 160   Labs: Results for orders placed or performed during the hospital encounter of 12/09/20 (from the past 24 hour(s))  Urinalysis, Routine w reflex microscopic Urine, Clean Catch     Status: Abnormal   Collection Time: 12/09/20  7:07 PM  Result Value Ref Range   Color, Urine YELLOW YELLOW   APPearance HAZY (A) CLEAR   Specific Gravity, Urine 1.026 1.005 - 1.030   pH 6.0 5.0 - 8.0   Glucose, UA  NEGATIVE NEGATIVE mg/dL   Hgb urine dipstick NEGATIVE NEGATIVE   Bilirubin Urine NEGATIVE NEGATIVE   Ketones, ur 80 (A) NEGATIVE mg/dL   Protein, ur NEGATIVE NEGATIVE mg/dL   Nitrite NEGATIVE NEGATIVE   Leukocytes,Ua NEGATIVE NEGATIVE  CBC with Differential/Platelet     Status: Abnormal   Collection Time: 12/09/20  9:56 PM  Result Value Ref Range   WBC 14.6 (H) 4.0 - 10.5 K/uL   RBC 4.86 3.87 - 5.11 MIL/uL   Hemoglobin 13.6 12.0 - 15.0 g/dL   HCT 23.5 57.3 - 22.0 %   MCV 86.0 80.0 - 100.0 fL   MCH 28.0 26.0 - 34.0 pg   MCHC 32.5 30.0 - 36.0 g/dL   RDW 25.4 27.0 - 62.3 %   Platelets 352 150 - 400 K/uL   nRBC 0.0 0.0 - 0.2 %   Neutrophils Relative % 74 %   Neutro Abs 10.8 (H) 1.7 - 7.7 K/uL   Lymphocytes Relative 18 %   Lymphs Abs 2.6 0.7 - 4.0 K/uL   Monocytes Relative 6 %   Monocytes Absolute 0.9 0.1 - 1.0 K/uL   Eosinophils Relative 1 %   Eosinophils Absolute 0.1 0.0 - 0.5 K/uL   Basophils Relative 0 %   Basophils Absolute 0.1 0.0 - 0.1 K/uL   Immature Granulocytes 1 %   Abs Immature Granulocytes 0.10 (H) 0.00 - 0.07 K/uL  Comprehensive metabolic panel     Status: Abnormal   Collection Time: 12/09/20  9:56 PM  Result Value Ref Range   Sodium 134 (L) 135 - 145 mmol/L   Potassium 3.8 3.5 - 5.1 mmol/L   Chloride 102 98 - 111 mmol/L   CO2 22 22 - 32 mmol/L   Glucose, Bld 78 70 - 99 mg/dL   BUN 6 6 - 20 mg/dL   Creatinine, Ser 7.62 0.44 - 1.00 mg/dL   Calcium 9.4 8.9 - 83.1 mg/dL   Total Protein 7.4 6.5 - 8.1 g/dL   Albumin 3.7 3.5 - 5.0 g/dL   AST 17 15 - 41 U/L   ALT 18 0 - 44 U/L   Alkaline Phosphatase 60 38 - 126 U/L   Total Bilirubin 0.6 0.3 - 1.2 mg/dL   GFR, Estimated >51 >76 mL/min   Anion gap 10 5 - 15  Lipase, blood     Status: None   Collection Time: 12/09/20  9:56 PM  Result Value Ref Range   Lipase 25 11 - 51 U/L   Imaging:  No results found.  MAU Course: Orders Placed This Encounter  Procedures   Urinalysis, Routine w reflex microscopic Urine,  Clean Catch   CBC with Differential/Platelet   Comprehensive metabolic panel   Lipase, blood  Discharge patient   Meds ordered this encounter  Medications   lactated ringers bolus 1,000 mL   promethazine (PHENERGAN) 25 mg in sodium chloride 0.9 % 50 mL IVPB   ondansetron (ZOFRAN) injection 4 mg   MDM: Ketones in urine (change from earlier today). Rehydrated with LR bolus and given phenergan+zofran IV with good relief of symptoms. Pt stated about 90% of the nausea is gone and she feels much better overall. Counseled that the presentation of her symptoms is more like gastroenteritis (either foodborne or viral) and she should expect some nausea until this phase of illness passes. Encouraged to take meds if needed, zofran ODT and can place phenergan vaginally if needed (reassured that these can be used together). Advised to sip fluids until they have stayed down consistently, then attempt bland solid foods and advance as tolerated. Pt expressed understanding.  Assessment: 1. Gastroenteritis   2. Presence of fetal heart sounds in first trimester    Plan: Discharge home in stable condition with return precautions  See AVS for additional education provided.   Follow-up Information     [place], [facility]. Go to.   Why: as scheduled for ongoing prenatal care Contact information: [address] [place] [address] [phone]                 Allergies as of 12/10/2020   No Known Allergies      Medication List     TAKE these medications    IRON PO Take by mouth.   ondansetron 4 MG disintegrating tablet Commonly known as: Zofran ODT Take 1 tablet (4 mg total) by mouth every 6 (six) hours as needed for nausea.   PRENATAL PO Take by mouth.   sertraline 100 MG tablet Commonly known as: ZOLOFT TAKE 1.5 TABLETS BY MOUTH DAILY   traZODone 100 MG tablet Commonly known as: DESYREL TAKE 1/2 TO 1 TABLET BY MOUTH AT BEDTIME AS NEEDED FOR  INSOMNIA       Edd Arbour, CNM, MSN, IBCLC Certified Nurse Midwife, [facility]

## 2020-12-30 IMAGING — US US THYROID
1 series · 13 of 25 positions shown · non-contrast
Comparison: 08/11/2018;

CLINICAL DATA: Prior ultrasound follow-up. Questionable right-sided
thyroid nodule demonstrated on thyroid ultrasound performed
08/11/2018 felt to represent a pseudonodule on attempted
ultrasound-guided fine-needle aspiration performed 08/18/2018.

EXAM:
THYROID ULTRASOUND
TECHNIQUE: Ultrasound examination of the thyroid gland and adjacent soft
tissues was performed.

[Series 1: us thyroid · 0.06mm/px · 13 of 46 slices shown]
[im 1/46]
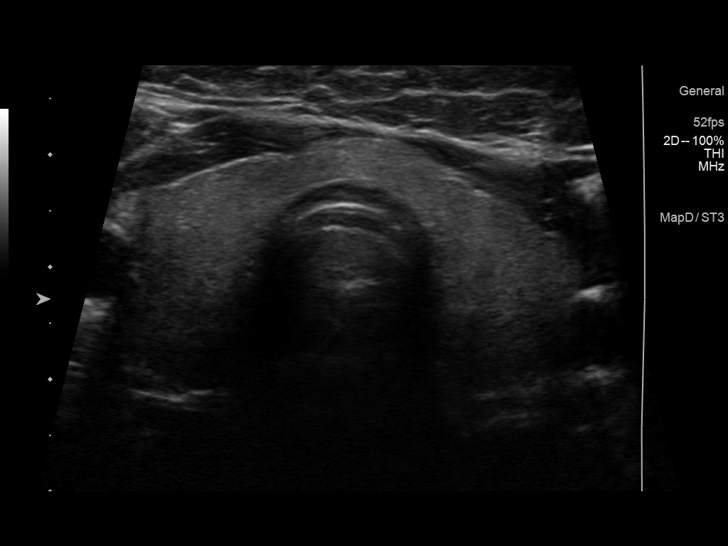
[im 4/46]
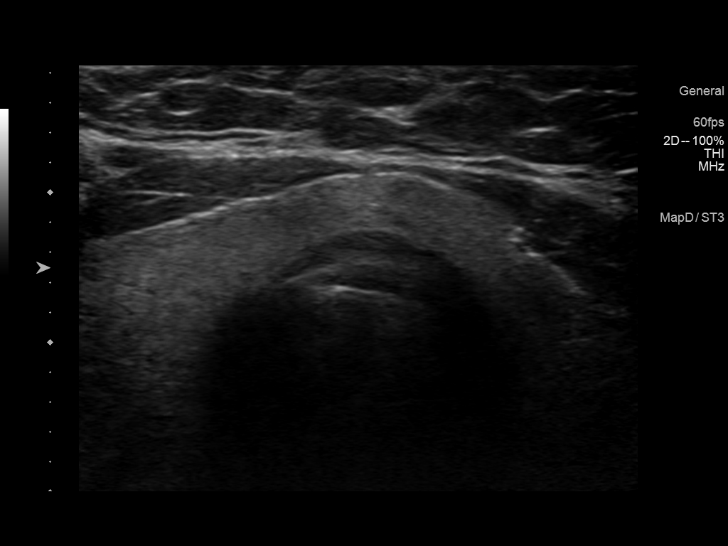
[im 8/46]
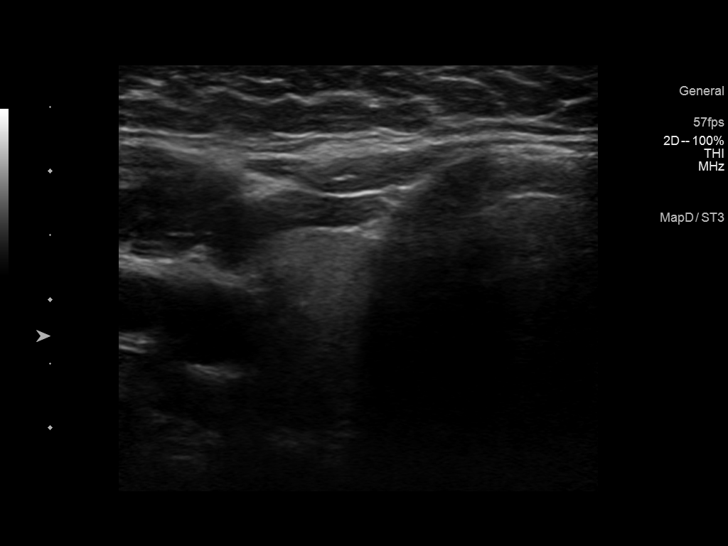
[im 12/46]
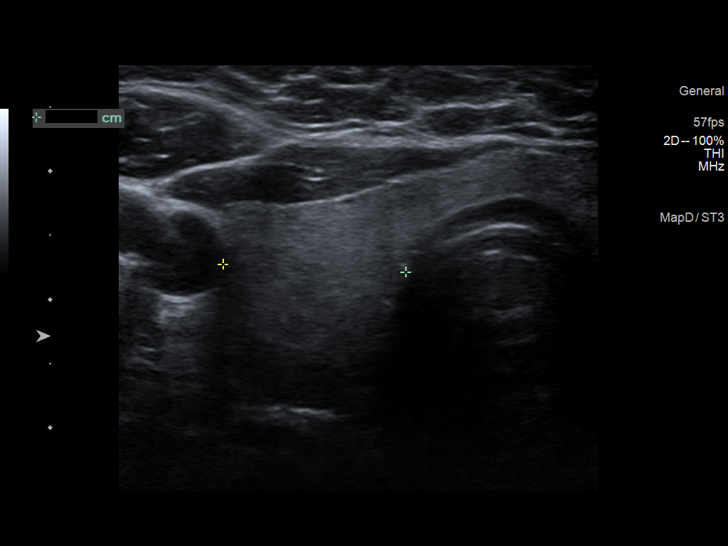
[im 16/46]
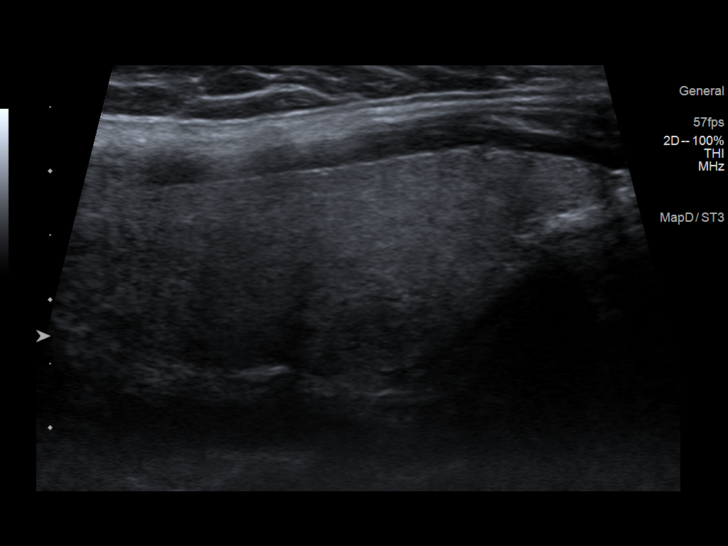
[im 19/46]
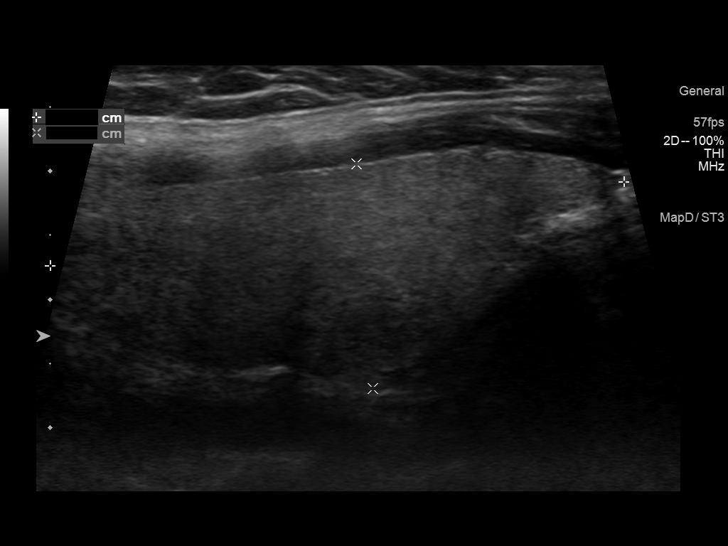
[im 23/46]
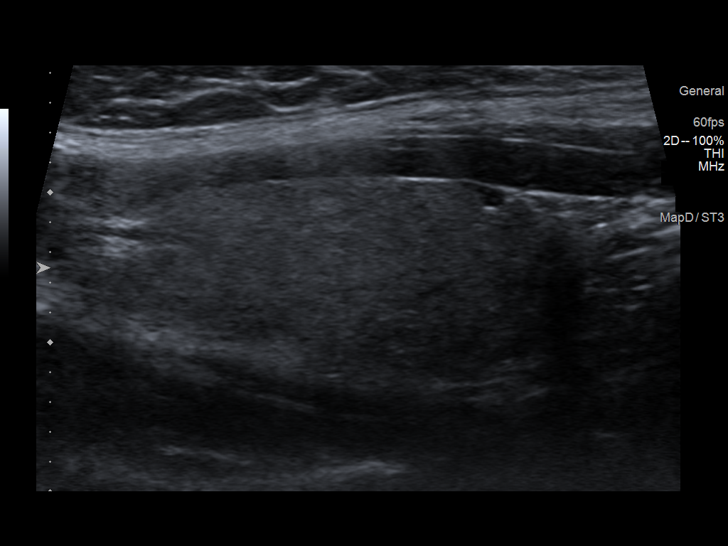
[im 27/46]
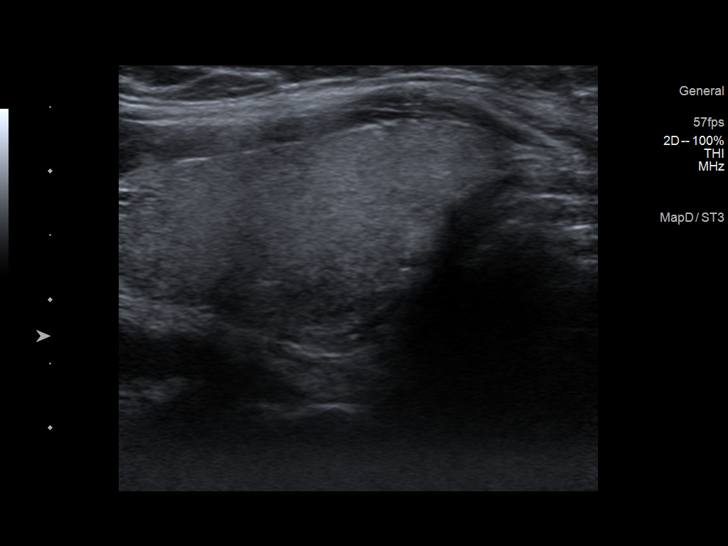
[im 31/46]
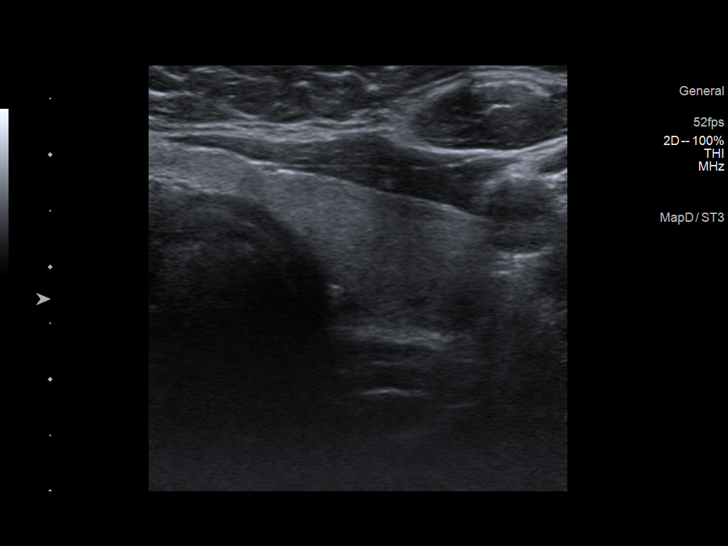
[im 34/46]
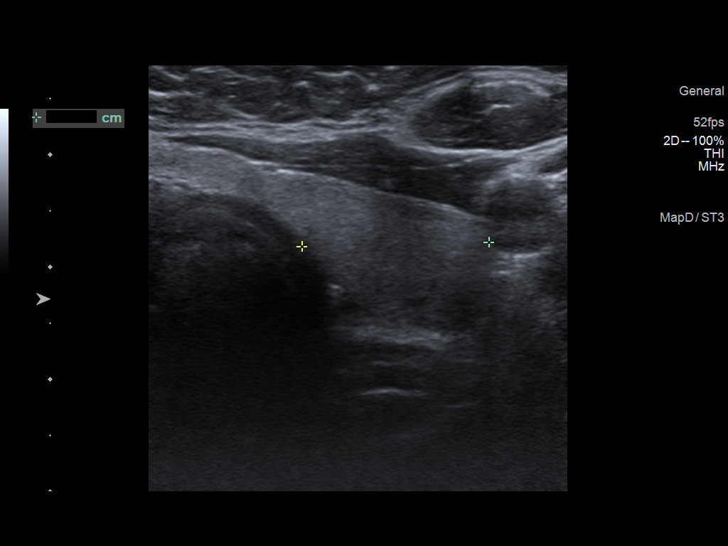
[im 38/46]
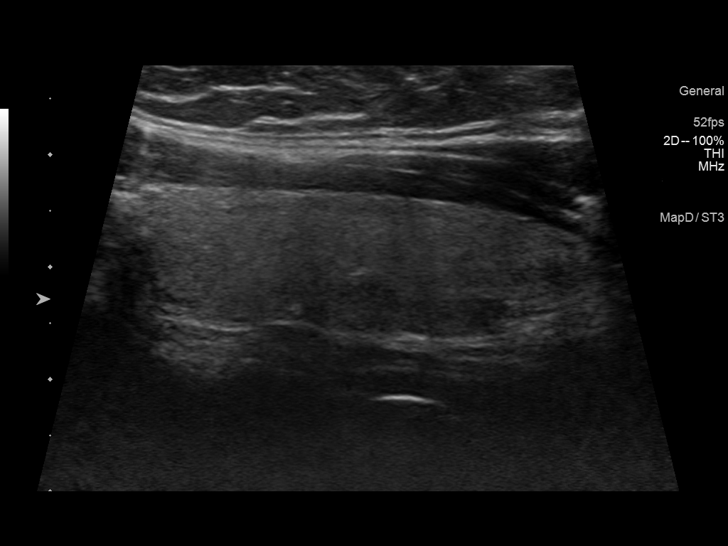
[im 42/46]
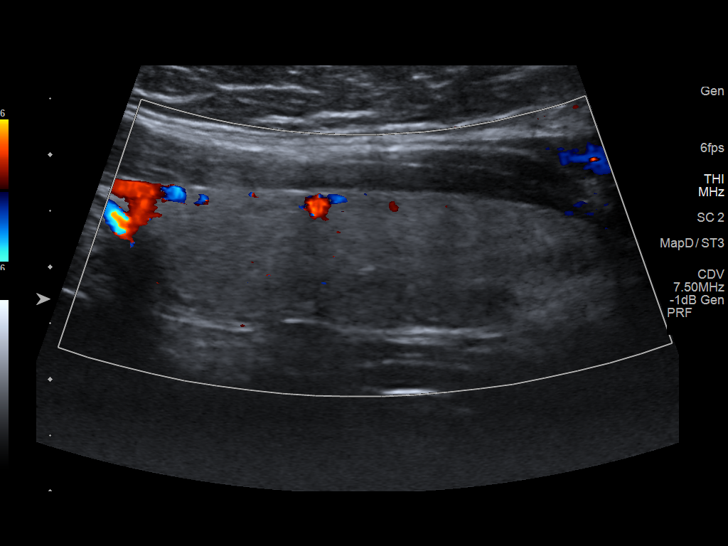
[im 46/46]
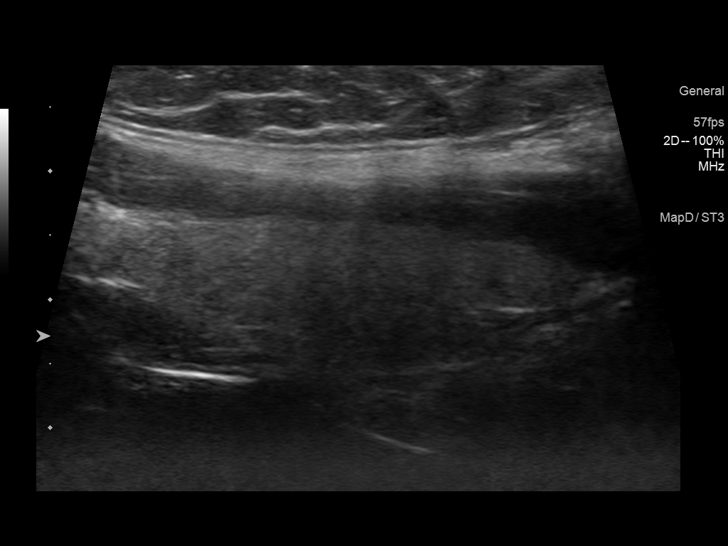

[13 of 25 positions shown; findings below may reference images not displayed]

attempted ultrasound-guided right-sided
thyroid nodule fine-needle aspiration-08/18/2018
FINDINGS: Parenchymal Echotexture: Mildly heterogenous

Isthmus: Normal in size measuring 0.4 cm in diameter, unchanged

Right lobe: Imaged normal in size measuring 4.5 x 1.8 x 1.4 cm,
unchanged previously, 4.4 x 1.4 x 1.5 cm

Left lobe: Normal in size measuring 4.3 x 1.3 x 1.7 cm, unchanged
previously, 4.6 x 1.1 x 1.7 cm

_________________________________________________________

Estimated total number of nodules >/= 1 cm: 0

Number of spongiform nodules >/=  2 cm not described below (TR1): 0

Number of mixed cystic and solid nodules >/= 1.5 cm not described
below (TR2): 0

_________________________________________________________

Previously questioned ill-defined hypoechoic nodule involving the
inferior posterior aspect the right lobe of the thyroid is again not
definitively seen on the present examination and thus favored to
have represented a pseudonodule as was suggested at the time of
ultrasound-guided fine-needle aspiration performed 08/18/2018.

No definitive new or enlarging thyroid nodules.
IMPRESSION: 1. No new or enlarging thyroid nodules.
2. Previously questioned nodule within the inferior posterior aspect
of the right lobe of the thyroid is not definitely seen on the
present examination and thus favored to represent a pseudonodule as
was questioned at the time of attempted ultrasound-guided
fine-needle aspiration performed 08/18/2018.

## 2020-12-31 ENCOUNTER — Other Ambulatory Visit: Payer: Self-pay | Admitting: Psychiatry

## 2020-12-31 DIAGNOSIS — G4700 Insomnia, unspecified: Secondary | ICD-10-CM

## 2021-01-01 ENCOUNTER — Other Ambulatory Visit: Payer: Self-pay | Admitting: Psychiatry

## 2021-01-01 DIAGNOSIS — F411 Generalized anxiety disorder: Secondary | ICD-10-CM

## 2021-01-01 NOTE — Telephone Encounter (Signed)
Please schedule appt

## 2021-01-01 NOTE — Telephone Encounter (Signed)
Last seen 5/26 due back in september

## 2021-01-02 NOTE — Telephone Encounter (Signed)
Pt has an appt tomorrow 11/4

## 2021-01-03 ENCOUNTER — Ambulatory Visit: Payer: BC Managed Care – PPO | Admitting: Psychiatry

## 2021-01-03 ENCOUNTER — Other Ambulatory Visit: Payer: Self-pay

## 2021-01-03 ENCOUNTER — Encounter: Payer: Self-pay | Admitting: Psychiatry

## 2021-01-03 DIAGNOSIS — F411 Generalized anxiety disorder: Secondary | ICD-10-CM | POA: Diagnosis not present

## 2021-01-03 DIAGNOSIS — F32A Depression, unspecified: Secondary | ICD-10-CM | POA: Diagnosis not present

## 2021-01-03 MED ORDER — BUPROPION HCL ER (XL) 150 MG PO TB24: 150.0000 mg | ORAL_TABLET | Freq: Every day | ORAL | 1 refills | Status: DC

## 2021-01-03 MED ORDER — SERTRALINE HCL 100 MG PO TABS: 200.0000 mg | ORAL_TABLET | Freq: Every day | ORAL | 0 refills | Status: DC

## 2021-01-03 NOTE — Progress Notes (Signed)
Cathy Bailey [id number] 05-31-1995 25 y.o.  Subjective:   Patient ID:  Cathy Bailey is a 25 y.o. (DOB October 10, 1995) female.  Chief Complaint:  Chief Complaint  Patient presents with   Depression   Anxiety     HPI [name] presents to the office today for follow-up of depression and anxiety. She reports she is, "getting married this weekend and having a baby in April." She reports that she has had severe n/v during pregnancy and has not been able to work for about 3 weeks.   Has been taking Sertraline at night and typically is able to keep it down. She reports that increases in Sertraline have been helpful for her mood and anxiety. She notices some increase in anxiety and depression since being out of work. She reports more worry and over-thinking. Rare panic. She reports that she has not been sleeping well. Reports that obgyn discontinued Trazodone. Able to take naps during the day. Energy has been lower. Motivation has also been low. Concentration has been ok. Appetite has been ok. Denies SI.   Not seeing a therapist.   Seeing Dr. [name] at [facility].  Past Psychiatric Medication Trials: Zonisamide- Prescribed for headaches. Not effective. Topamax- Prescribed for headaches. Ineffective Sertraline- Helpful for anxiety. Less helpful for depression.  Trazodone- Minimally effective  GAD-7    Flowsheet Row Office Visit from 01/03/2021 in [facility]  Total GAD-7 Score 12      PHQ2-9    Flowsheet Row Office Visit from 01/03/2021 in [facility] Office Visit from 10/22/2016 in [facility] Office Visit from 04/02/2016 in [facility]  PHQ-2 Total Score 5 0 0  PHQ-9 Total Score 13 -- --      Flowsheet Row Admission (Discharged) from 12/09/2020 in [facility]S Maternity Assessment Unit ED from 09/11/2020 in [facility]  C-SSRS RISK CATEGORY No Risk No Risk        Review of  Systems:  Review of Systems  Gastrointestinal:  Positive for nausea.  Musculoskeletal:  Negative for gait problem.  Psychiatric/Behavioral:         Please refer to HPI   Medications: I have reviewed the patient's current medications.  Current Outpatient Medications  Medication Sig Dispense Refill   buPROPion (WELLBUTRIN XL) 150 MG 24 hr tablet Take 1 tablet (150 mg total) by mouth daily. 30 tablet 1   ondansetron (ZOFRAN ODT) 4 MG disintegrating tablet Take 1 tablet (4 mg total) by mouth every 6 (six) hours as needed for nausea. 20 tablet 0   Prenatal Vit-Fe Fumarate-FA (PRENATAL PO) Take by mouth.     Ferrous Sulfate (IRON PO) Take by mouth.     promethazine (PHENERGAN) 12.5 MG tablet promethazine 12.5 mg tablet  TAKE 1 TABLET BY MOUTH FOUR TIMES A DAY     sertraline (ZOLOFT) 100 MG tablet Take 2 tablets (200 mg total) by mouth daily. 180 tablet 0   No current facility-administered medications for this visit.    Medication Side Effects: None  Allergies: No Known Allergies  Past Medical History:  Diagnosis Date   Anemia    Anxiety    Dysmenorrhea 07/05/2012   Encounter for menstrual extraction 01/22/2014   Irregular bleeding 05/24/2014   Irregular periods 04/03/2015   Migraines    Seasonal allergies     Past Medical History, Surgical history, Social history, and Family history were reviewed and updated as appropriate.   Please see review of systems for  further details on the patient's review from today.   Objective:   Physical Exam:  LMP 08/21/2020 (Exact Date)   Physical Exam Constitutional:      General: She is not in acute distress. Musculoskeletal:        General: No deformity.  Neurological:     Mental Status: She is alert and oriented to person, place, and time.     Coordination: Coordination normal.  Psychiatric:        Attention and Perception: Attention and perception normal. She does not perceive auditory or visual hallucinations.        Mood and  Affect: Mood is anxious and depressed. Affect is not labile, blunt, angry or inappropriate.        Speech: Speech normal.        Behavior: Behavior normal.        Thought Content: Thought content normal. Thought content is not paranoid or delusional. Thought content does not include homicidal or suicidal ideation. Thought content does not include homicidal or suicidal plan.        Cognition and Memory: Cognition and memory normal.        Judgment: Judgment normal.     Comments: Insight intact    Lab Review:     Component Value Date/Time   NA 134 (L) 12/09/2020 2156   K 3.8 12/09/2020 2156   CL 102 12/09/2020 2156   CO2 22 12/09/2020 2156   GLUCOSE 78 12/09/2020 2156   BUN 6 12/09/2020 2156   CREATININE 0.51 12/09/2020 2156   CALCIUM 9.4 12/09/2020 2156   PROT 7.4 12/09/2020 2156   ALBUMIN 3.7 12/09/2020 2156   AST 17 12/09/2020 2156   ALT 18 12/09/2020 2156   ALKPHOS 60 12/09/2020 2156   BILITOT 0.6 12/09/2020 2156   GFRNONAA >60 12/09/2020 2156   GFRAA >60 08/30/2015 0101       Component Value Date/Time   WBC 14.6 (H) 12/09/2020 2156   RBC 4.86 12/09/2020 2156   HGB 13.6 12/09/2020 2156   HCT 41.8 12/09/2020 2156   PLT 352 12/09/2020 2156   MCV 86.0 12/09/2020 2156   MCH 28.0 12/09/2020 2156   MCHC 32.5 12/09/2020 2156   RDW 13.1 12/09/2020 2156   LYMPHSABS 2.6 12/09/2020 2156   MONOABS 0.9 12/09/2020 2156   EOSABS 0.1 12/09/2020 2156   BASOSABS 0.1 12/09/2020 2156    No results found for: POCLITH, LITHIUM   No results found for: PHENYTOIN, PHENOBARB, VALPROATE, CBMZ   .res Assessment: Plan:   Pt seen for 30 minutes and time spent discussing treatment options for depression. She reports that she would like to continue Sertraline 200 mg po qd since Sertraline has been helpful for her anxiety. Discussed augmentation strategies for depression. Discussed potential benefits, risks, and side effects of Wellbutrin XL. Discussed that Wellbutrin XL is generally considered  safe during pregnancy. Pt agrees to trial of Wellbutrin XL. Will start Wellbutrin XL 150 mg po qd for depression. Recommended waiting until next week to start Wellbutrin XL in the even that she experiences side effects this weekend while getting married and traveling. Also recommended discussing Wellbutrin XL with her obgyn during scheduled apt next week.  Will continue Sertraline 200 mg po qd for anxiety and depression.  Pt to follow-up in 6 weeks or sooner if clinically indicated.  Patient advised to contact office with any questions, adverse effects, or acute worsening in signs and symptoms.  Jia was seen today for depression and anxiety.  Diagnoses and  all orders for this visit:  Depression, unspecified depression type -     buPROPion (WELLBUTRIN XL) 150 MG 24 hr tablet; Take 1 tablet (150 mg total) by mouth daily.  Generalized anxiety disorder -     sertraline (ZOLOFT) 100 MG tablet; Take 2 tablets (200 mg total) by mouth daily.    Please see After Visit Summary for patient specific instructions.  Future Appointments  Date Time Provider Department Center  02/14/2021  1:45 PM Corie Chiquito, PMHNP [facility] None  04/10/2021  1:00 PM Stevphen Meuse, [facility] None     No orders of the defined types were placed in this encounter.   -------------------------------

## 2021-01-20 ENCOUNTER — Ambulatory Visit: Payer: BC Managed Care – PPO

## 2021-01-25 ENCOUNTER — Other Ambulatory Visit: Payer: Self-pay | Admitting: Psychiatry

## 2021-01-25 DIAGNOSIS — F32A Depression, unspecified: Secondary | ICD-10-CM

## 2021-01-29 ENCOUNTER — Other Ambulatory Visit: Payer: Self-pay | Admitting: Psychiatry

## 2021-01-29 DIAGNOSIS — G4700 Insomnia, unspecified: Secondary | ICD-10-CM

## 2021-02-14 ENCOUNTER — Other Ambulatory Visit: Payer: Self-pay | Admitting: Family Medicine

## 2021-02-14 ENCOUNTER — Ambulatory Visit: Payer: BC Managed Care – PPO | Admitting: Psychiatry

## 2021-03-02 NOTE — L&D Delivery Note (Signed)
Delivery Note ?At 12:33 AM a viable female was delivered via Vaginal, Spontaneous (Presentation: Left Occiput Anterior).  APGAR: 9, 9; weight  .   ?Placenta status: Spontaneous, Intact.  Cord: 3 vessels with the following complications: None.   ? ?Nuchal cord noted and reduced mid delivery. ?Uterus manually explored and clear of clot and debris ? ?Anesthesia: Epidural ?Episiotomy: None ?Lacerations: 2nd degree, RV exam confirms intact.  ?Suture Repair: 2.0 vicryl rapide ?Est. Blood Loss (mL):  250 cc ? ?Mom to postpartum.  Baby to Couplet care / Skin to Skin. ? ?Lyn Henri ?06/17/2021, 12:58 AM ? ? ? ?

## 2021-03-20 ENCOUNTER — Ambulatory Visit: Payer: BC Managed Care – PPO | Admitting: Obstetrics and Gynecology

## 2021-03-24 NOTE — Progress Notes (Signed)
Patient no show for new ob visit

## 2021-04-09 ENCOUNTER — Other Ambulatory Visit: Payer: Self-pay | Admitting: Psychiatry

## 2021-04-09 ENCOUNTER — Other Ambulatory Visit: Payer: Self-pay

## 2021-04-09 ENCOUNTER — Other Ambulatory Visit: Payer: Self-pay | Admitting: Obstetrics and Gynecology

## 2021-04-09 DIAGNOSIS — F32A Depression, unspecified: Secondary | ICD-10-CM

## 2021-04-09 DIAGNOSIS — Z363 Encounter for antenatal screening for malformations: Secondary | ICD-10-CM

## 2021-04-10 ENCOUNTER — Ambulatory Visit: Payer: BC Managed Care – PPO | Admitting: Psychiatry

## 2021-04-11 ENCOUNTER — Ambulatory Visit: Payer: BC Managed Care – PPO | Admitting: *Deleted

## 2021-04-11 ENCOUNTER — Ambulatory Visit: Payer: BC Managed Care – PPO | Attending: Obstetrics and Gynecology

## 2021-04-11 ENCOUNTER — Other Ambulatory Visit: Payer: Self-pay

## 2021-04-11 ENCOUNTER — Ambulatory Visit: Payer: Medicaid Other | Attending: Obstetrics and Gynecology | Admitting: Obstetrics

## 2021-04-11 ENCOUNTER — Encounter: Payer: Self-pay | Admitting: *Deleted

## 2021-04-11 ENCOUNTER — Other Ambulatory Visit: Payer: Self-pay | Admitting: *Deleted

## 2021-04-11 VITALS — BP 118/64 | HR 92

## 2021-04-11 DIAGNOSIS — O35EXX0 Maternal care for other (suspected) fetal abnormality and damage, fetal genitourinary anomalies, not applicable or unspecified: Secondary | ICD-10-CM

## 2021-04-11 DIAGNOSIS — Z3A29 29 weeks gestation of pregnancy: Secondary | ICD-10-CM

## 2021-04-11 DIAGNOSIS — O283 Abnormal ultrasonic finding on antenatal screening of mother: Secondary | ICD-10-CM

## 2021-04-11 DIAGNOSIS — Q638 Other specified congenital malformations of kidney: Secondary | ICD-10-CM

## 2021-04-11 DIAGNOSIS — O358XX0 Maternal care for other (suspected) fetal abnormality and damage, not applicable or unspecified: Secondary | ICD-10-CM | POA: Diagnosis not present

## 2021-04-11 DIAGNOSIS — Z3689 Encounter for other specified antenatal screening: Secondary | ICD-10-CM | POA: Diagnosis not present

## 2021-04-11 DIAGNOSIS — Z363 Encounter for antenatal screening for malformations: Secondary | ICD-10-CM | POA: Diagnosis not present

## 2021-04-11 DIAGNOSIS — O09899 Supervision of other high risk pregnancies, unspecified trimester: Secondary | ICD-10-CM

## 2021-04-11 NOTE — Progress Notes (Signed)
MFM Note  Cathy Bailey was seen due to pyelectasis. The patient reports that mild pyelectasis was noted in the fetal kidneys during her fetal anatomy scan and has persisted into the third trimester.  She denies any significant past medical history and denies any problems in her current pregnancy.    She had a cell free DNA test earlier in her pregnancy which indicated a low risk for trisomy 81, 31, and 13. A female fetus is predicted.   She was informed that the fetal growth and amniotic fluid level were appropriate for her gestational age.   The following were noted on today's exam:  Bilateral pyelectasis  Bilateral pyelectasis measuring 0.9-1.0 cm dilated was noted on today's ultrasound exam.    The implications and management of pylectasiswas discussed with the patient. She was advised that the pyelectasis noted on prenatal ultrasounds will often resolve spontaneously after birth.  Pyelectasis is noted more commonly in female fetuses.  However, there may also be other processes such as an obstruction or reflux causing this finding that may require treatment after birth.    She was advised that we will continue to follow her closely to assess this finding.  Should the renal pelvis continue to enlarge, we will consider a referral to pediatric urology/nephrology for a prenatal consultation.  The small association between pyelectasis and Down syndrome was discussed.  She was offered and declined an amniocentesis today for definitive diagnosis of fetal aneuploidy.  She was reassured by the low risk for Down syndrome indicated by her cell free DNA test.  Fetus with a two-vessel umbilical cord  A two-vessel umbilical cord was noted on today's exam.    The implications and management of a two-vessel umbilical cord was discussed in detail with the patient today.  She was advised regarding the small association of trisomy 49 with a two-vessel cord. The patient was reassured that based on her  negative cell free DNA test and as there were no other anomalies noted on today's exam, that it is highly unlikely that her baby will have trisomy 69.      Due to the small possibility of trisomy 18, the patient was offered and declined an amniocentesis today for definitive diagnosis of fetal aneuploidy. The small risk of fetal growth issues later in her pregnancy due to the two-vessel cord was also discussed today.    The patient was reassured today that the two-vessel cord is most likely a normal variant and that her baby will most likely not be affected by this finding after delivery.    The patient was informed that anomalies may be missed due to technical limitations. If the fetus is in a suboptimal position or maternal habitus is increased, visualization of the fetus in the maternal uterus may be impaired.  A follow-up exam was scheduled in 4 weeks to assess the fetal growth and the fetal kidneys.    The patient stated that all of her questions were answered today.  A total of 30 minutes was spent counseling and coordinating the care for this patient.  Greater than 50% of the time was spent in direct face-to-face contact.

## 2021-05-01 ENCOUNTER — Other Ambulatory Visit: Payer: Self-pay

## 2021-05-01 ENCOUNTER — Inpatient Hospital Stay (HOSPITAL_COMMUNITY)
Admission: AD | Admit: 2021-05-01 | Discharge: 2021-05-01 | Disposition: A | Discharge status: Home / Self Care | Payer: BC Managed Care – PPO | Attending: Obstetrics and Gynecology | Admitting: Obstetrics and Gynecology

## 2021-05-01 ENCOUNTER — Encounter (HOSPITAL_COMMUNITY): Payer: Self-pay | Admitting: Obstetrics and Gynecology

## 2021-05-01 DIAGNOSIS — R519 Headache, unspecified: Secondary | ICD-10-CM | POA: Diagnosis not present

## 2021-05-01 DIAGNOSIS — Z3A32 32 weeks gestation of pregnancy: Secondary | ICD-10-CM | POA: Diagnosis not present

## 2021-05-01 DIAGNOSIS — O26893 Other specified pregnancy related conditions, third trimester: Secondary | ICD-10-CM | POA: Diagnosis not present

## 2021-05-01 DIAGNOSIS — R030 Elevated blood-pressure reading, without diagnosis of hypertension: Secondary | ICD-10-CM | POA: Diagnosis present

## 2021-05-01 LAB — URINALYSIS, ROUTINE W REFLEX MICROSCOPIC
Bilirubin Urine: NEGATIVE
Glucose, UA: NEGATIVE mg/dL
Hgb urine dipstick: NEGATIVE
Ketones, ur: NEGATIVE mg/dL
Leukocytes,Ua: NEGATIVE
Nitrite: NEGATIVE
Protein, ur: NEGATIVE mg/dL
Specific Gravity, Urine: 1.004 — ABNORMAL LOW (ref 1.005–1.030)
pH: 7 (ref 5.0–8.0)

## 2021-05-01 LAB — COMPREHENSIVE METABOLIC PANEL
ALT: 12 U/L (ref 0–44)
AST: 17 U/L (ref 15–41)
Albumin: 2.7 g/dL — ABNORMAL LOW (ref 3.5–5.0)
Alkaline Phosphatase: 85 U/L (ref 38–126)
Anion gap: 11 (ref 5–15)
BUN: 5 mg/dL — ABNORMAL LOW (ref 6–20)
CO2: 21 mmol/L — ABNORMAL LOW (ref 22–32)
Calcium: 8.4 mg/dL — ABNORMAL LOW (ref 8.9–10.3)
Chloride: 104 mmol/L (ref 98–111)
Creatinine, Ser: 0.49 mg/dL (ref 0.44–1.00)
GFR, Estimated: >60 mL/min (ref >60)
Glucose, Bld: 97 mg/dL (ref 70–99)
Potassium: 3.6 mmol/L (ref 3.5–5.1)
Sodium: 136 mmol/L (ref 135–145)
Total Bilirubin: 0.2 mg/dL — ABNORMAL LOW (ref 0.3–1.2)
Total Protein: 6.2 g/dL — ABNORMAL LOW (ref 6.5–8.1)

## 2021-05-01 LAB — CBC WITH DIFFERENTIAL/PLATELET
Abs Immature Granulocytes: 0.15 10*3/uL — ABNORMAL HIGH (ref 0.00–0.07)
Basophils Absolute: 0.1 10*3/uL (ref 0.0–0.1)
Basophils Relative: 0 %
Eosinophils Absolute: 0.1 10*3/uL (ref 0.0–0.5)
Eosinophils Relative: 1 %
HCT: 34.2 % — ABNORMAL LOW (ref 36.0–46.0)
Hemoglobin: 11.3 g/dL — ABNORMAL LOW (ref 12.0–15.0)
Immature Granulocytes: 1 %
Lymphocytes Relative: 16 %
Lymphs Abs: 2.5 10*3/uL (ref 0.7–4.0)
MCH: 27.4 pg (ref 26.0–34.0)
MCHC: 33 g/dL (ref 30.0–36.0)
MCV: 82.8 fL (ref 80.0–100.0)
Monocytes Absolute: 1 10*3/uL (ref 0.1–1.0)
Monocytes Relative: 7 %
Neutro Abs: 12.1 10*3/uL — ABNORMAL HIGH (ref 1.7–7.7)
Neutrophils Relative %: 75 %
Platelets: 322 10*3/uL (ref 150–400)
RBC: 4.13 MIL/uL (ref 3.87–5.11)
RDW: 13 % (ref 11.5–15.5)
WBC: 15.9 10*3/uL — ABNORMAL HIGH (ref 4.0–10.5)
nRBC: 0 % (ref 0.0–0.2)

## 2021-05-01 LAB — PROTEIN / CREATININE RATIO, URINE
Creatinine, Urine: 24.55 mg/dL
Total Protein, Urine: <6 mg/dL

## 2021-05-01 NOTE — MAU Note (Signed)
...  Cathy Bailey is a 26 y.o. at [redacted]w[redacted]d here in MAU reporting: Was seen in office today for ROB visit. She states while she was there her BP was 150/90. She states she wasn't experiencing any visual disturbances or HA's while at the office but since leaving the office she is now experiencing dizziness and has a HA. +FM. No VB or LOF.  ? ?She states she occasional will experience visual floaters but denies epigastric/RUQ pain, and swelling of her hands, feet, and face. ? ?Pain score:  ?6/10 HA ? ?FHT: 143 initial external ?Lab orders placed from triage: UA ? ?

## 2021-05-01 NOTE — Discharge Instructions (Signed)
Please return to the MAU if your blood pressure is >160/110, severe headache that is different than usual or not improving with usual technique (tylenol, rest, water, massage), persistent blurred vision, large leakage of fluid, persistent painful contractions >1 hour, or vaginal bleeding.  ?

## 2021-05-01 NOTE — MAU Provider Note (Signed)
?History  ?  ? ?CSN: [id number] ? ?Arrival date and time: 05/01/21 1439 ? ?  ?Chief Complaint  ?Patient presents with  ? Dizziness  ? Hypertension  ? Headache  ? ?Cathy Bailey is a 26 yo F G1P0 presenting to MAU for evaluation of high blood pressure, headaches, and dizziness. She follows with [facility] for prenatal care, pregnancy complicated by fetal renal pyelectasis. No known hypertensive disorder.  ? ?She reports that she was at a routine prenatal appointment today and her blood pressure was 150/90. States she was talking with her dad when it was checked and she was bothered at the time. It is usually around 110-120 systolic.  She felt at baseline during the appointment, however on the way home she started experiencing a bilateral headache behind her eyes.  Sought evaluation because of the elevated blood pressure which worried her.  She has a history of chronic headaches during and before pregnancy, reports this feels the exact same as her usual.  Describes as dull ache.  Not bothering her much and usually goes away without treatment.  Denies any associated lightheadedness/dizziness, blurred vision, extremity weakness/numbness.  Denies any RUQ abdominal pain, extremity edema, leakage of fluid, vaginal bleeding.  Normal fetal movement. ? ?Clarified chief complaint of dizziness.  She reports she will occasionally feel lightheaded whenever changing positions too fast, but does not feel lightheaded or dizzy now. ? ? ?Past Medical History:  ?Diagnosis Date  ? Anemia   ? Anxiety   ? Dysmenorrhea 07/05/2012  ? Encounter for menstrual extraction 01/22/2014  ? Irregular bleeding 05/24/2014  ? Irregular periods 04/03/2015  ? Migraines   ? Seasonal allergies   ? ? ?Past Surgical History:  ?Procedure Laterality Date  ? TONSILLECTOMY AND ADENOIDECTOMY    ? WISDOM TOOTH EXTRACTION    ? ? ?Family History  ?Problem Relation Age of Onset  ? Hypertension Father   ? Cancer Maternal Grandmother 60  ?     breast  ? Hyperlipidemia  Maternal Grandmother   ? Hypertension Maternal Grandfather   ? Macular degeneration Maternal Grandfather   ? Atrial fibrillation Maternal Grandfather   ? Congestive Heart Failure Maternal Grandfather   ? Thyroid disease Paternal Grandmother   ? Hypertension Paternal Grandmother   ? Atrial fibrillation Paternal Grandmother   ? Hypertension Paternal Grandfather   ? ? ?Social History  ? ?Tobacco Use  ? Smoking status: Never  ? Smokeless tobacco: Never  ?Vaping Use  ? Vaping Use: Never used  ?Substance Use Topics  ? Alcohol use: Yes  ?  Alcohol/week: 3.0 - 4.0 standard drinks  ?  Types: 3 - 4 Glasses of wine per week  ? Drug use: No  ? ? ?Allergies: No Known Allergies ? ?No medications prior to admission.  ? ? ?Review of Systems  ?Constitutional:  Negative for chills, fatigue and fever.  ?Respiratory:  Negative for shortness of breath.   ?Cardiovascular:  Negative for chest pain and leg swelling.  ?Gastrointestinal:  Negative for abdominal pain, constipation, diarrhea, nausea and vomiting.  ?Genitourinary:  Negative for dysuria and vaginal bleeding.  ?Skin:  Negative for rash.  ?Neurological:  Positive for headaches. Negative for dizziness, syncope, weakness, light-headedness and numbness.  ?Psychiatric/Behavioral:  Negative for behavioral problems.   ?Physical Exam  ? ?Blood pressure 114/84, pulse 92, temperature 98.1 ?F (36.7 ?C), temperature source Oral, resp. rate 17, height 5\' 7"  (1.702 m), weight 100.5 kg, last menstrual period 08/21/2020. ? ?Vitals:  ? 05/01/21 1516 05/01/21 1531 05/01/21  1546 05/01/21 1601  ?BP: (!) 99/55 110/66 105/71 114/84  ?Pulse: (!) 101 91 95 92  ?Temp:      ?Resp:      ?Height:      ?Weight:      ?TempSrc:      ?BMI (Calculated):      ?  ? ?Physical Exam ?Constitutional:   ?   General: She is not in acute distress. ?   Appearance: She is well-developed. She is not ill-appearing or diaphoretic.  ?HENT:  ?   Head: Normocephalic and atraumatic.  ?   Mouth/Throat:  ?   Mouth: Mucous membranes  are moist.  ?Eyes:  ?   Extraocular Movements: Extraocular movements intact.  ?Cardiovascular:  ?   Rate and Rhythm: Normal rate.  ?Pulmonary:  ?   Effort: Pulmonary effort is normal.  ?Abdominal:  ?   Comments: Gravid appropriate for gestational age   ?Musculoskeletal:  ?   Cervical back: Normal range of motion and neck supple.  ?Skin: ?   General: Skin is warm.  ?   Capillary Refill: Capillary refill takes less than 2 seconds.  ?Neurological:  ?   Mental Status: She is alert.  ?   Comments: CN II-XII intact.  Able to follow complex commands without difficulty.  5/5 upper and lower extremity strength.  Normal gait.  Sensation to light touch intact throughout.  ?Psychiatric:     ?   Behavior: Behavior normal.  ? ?NST: REACTIVE  ?130 bpm  ?Mod var  ?Several 15x15 accelerations  ?No decelerations  ?No ctx seen on toco ? ?MAU Course  ? ?MDM ?CMP, CBC, P/cr ratio  ?Offered tylenol, declined treatment as HA was minimally bothersome and her usual.  ? ?Assessment and Plan  ? ?1. Pregnancy headache in third trimester ?Consistent with her usual HA, mild and declined treatment. Neurologically intact. Had a spurious elevated BP at her OB visit today, but normal during MAU evaluation (SBP 99-120) which is her normal through review of office visits. Pre-e labs WNL.  ? ?2. [redacted] weeks gestation of pregnancy ?Normal fetal movement with reactive NST.  ? ?MAU precautions discussed, return if worsening/not improving. Discharged home in stable condition.  ?  ?Cathy Bailey ?05/01/2021, 4:40 PM  ?

## 2021-05-09 ENCOUNTER — Other Ambulatory Visit: Payer: Self-pay | Admitting: Psychiatry

## 2021-05-09 DIAGNOSIS — F411 Generalized anxiety disorder: Secondary | ICD-10-CM

## 2021-05-09 DIAGNOSIS — F32A Depression, unspecified: Secondary | ICD-10-CM

## 2021-05-14 ENCOUNTER — Ambulatory Visit: Payer: BC Managed Care – PPO

## 2021-05-26 ENCOUNTER — Encounter (HOSPITAL_COMMUNITY): Payer: Self-pay | Admitting: Obstetrics and Gynecology

## 2021-05-26 ENCOUNTER — Inpatient Hospital Stay (HOSPITAL_COMMUNITY)
Admission: AD | Admit: 2021-05-26 | Discharge: 2021-05-26 | Disposition: A | Discharge status: Home / Self Care | Payer: BC Managed Care – PPO | Attending: Obstetrics and Gynecology | Admitting: Obstetrics and Gynecology

## 2021-05-26 DIAGNOSIS — Z3A36 36 weeks gestation of pregnancy: Secondary | ICD-10-CM | POA: Diagnosis not present

## 2021-05-26 DIAGNOSIS — M549 Dorsalgia, unspecified: Secondary | ICD-10-CM

## 2021-05-26 DIAGNOSIS — O26893 Other specified pregnancy related conditions, third trimester: Secondary | ICD-10-CM | POA: Diagnosis not present

## 2021-05-26 DIAGNOSIS — O99891 Other specified diseases and conditions complicating pregnancy: Secondary | ICD-10-CM | POA: Diagnosis not present

## 2021-05-26 LAB — URINALYSIS, ROUTINE W REFLEX MICROSCOPIC
Bilirubin Urine: NEGATIVE
Glucose, UA: NEGATIVE mg/dL
Hgb urine dipstick: NEGATIVE
Ketones, ur: NEGATIVE mg/dL
Leukocytes,Ua: NEGATIVE
Nitrite: NEGATIVE
Protein, ur: NEGATIVE mg/dL
Specific Gravity, Urine: 1.018 (ref 1.005–1.030)
pH: 8 (ref 5.0–8.0)

## 2021-05-26 MED ORDER — CYCLOBENZAPRINE HCL 5 MG PO TABS
5.0000 mg | ORAL_TABLET | Freq: Once | ORAL | Status: AC
  Administered 2021-05-26: 5 mg via ORAL
  Filled 2021-05-26: qty 1

## 2021-05-26 MED ORDER — CYCLOBENZAPRINE HCL 10 MG PO TABS: 5.0000 mg | ORAL_TABLET | Freq: Three times a day (TID) | ORAL | 2 refills | Status: DC | PRN

## 2021-05-26 NOTE — MAU Provider Note (Signed)
?History  ?  ? ?CSN: [id number] ? ?Arrival date and time: 05/26/21 0859 ? ? Event Date/Time  ? First Provider Initiated Contact with Patient 05/26/21 [phone]   ?  ? ?Chief Complaint  ?Patient presents with  ? Abdominal Pain  ? Back Pain  ? Contractions  ? ?HPI ?This is a 26 year old G1 at 36 weeks and 2 days who presents with back pain and and lower abdominal cramping that started around midnight.  The pain is constant with no palliating or provoking factors.  She did take Tylenol earlier today without improvement.  There is no change in pain with movements and transitional movements.  Has good fetal activity.  She rates the pain 6-7 out of 10 with radiation of her back pain around the front.  No leaking fluid or bleeding.  She was a bit more active yesterday than normal due to her having maternity pictures taken. ? ?OB History   ? ? Gravida  ?1  ? Para  ?0  ? Term  ?0  ? Preterm  ?0  ? AB  ?0  ? Living  ?0  ?  ? ? SAB  ?0  ? IAB  ?0  ? Ectopic  ?0  ? Multiple  ?0  ? Live Births  ?   ?   ?  ?  ? ? ?Past Medical History:  ?Diagnosis Date  ? Anemia   ? Anxiety   ? Dysmenorrhea 07/05/2012  ? Encounter for menstrual extraction 01/22/2014  ? Irregular bleeding 05/24/2014  ? Irregular periods 04/03/2015  ? Migraines   ? Seasonal allergies   ? ? ?Past Surgical History:  ?Procedure Laterality Date  ? TONSILLECTOMY AND ADENOIDECTOMY    ? WISDOM TOOTH EXTRACTION    ? ? ?Family History  ?Problem Relation Age of Onset  ? Hypertension Father   ? Cancer Maternal Grandmother 60  ?     breast  ? Hyperlipidemia Maternal Grandmother   ? Hypertension Maternal Grandfather   ? Macular degeneration Maternal Grandfather   ? Atrial fibrillation Maternal Grandfather   ? Congestive Heart Failure Maternal Grandfather   ? Thyroid disease Paternal Grandmother   ? Hypertension Paternal Grandmother   ? Atrial fibrillation Paternal Grandmother   ? Hypertension Paternal Grandfather   ? ? ?Social History  ? ?Tobacco Use  ? Smoking status: Never  ?  Smokeless tobacco: Never  ?Vaping Use  ? Vaping Use: Never used  ?Substance Use Topics  ? Alcohol use: Yes  ?  Alcohol/week: 3.0 - 4.0 standard drinks  ?  Types: 3 - 4 Glasses of wine per week  ? Drug use: No  ? ? ?Allergies: No Known Allergies ? ?Medications Prior to Admission  ?Medication Sig Dispense Refill Last Dose  ? pantoprazole (PROTONIX) 20 MG tablet Take 20 mg by mouth daily.   05/25/2021  ? Prenatal Vit-Fe Fumarate-FA (PRENATAL PO) Take by mouth.   05/25/2021  ? buPROPion (WELLBUTRIN XL) 150 MG 24 hr tablet TAKE 1 TABLET BY MOUTH EVERY DAY 30 tablet 0   ? Ferrous Sulfate (IRON PO) Take by mouth. (Patient not taking: Reported on 04/11/2021)     ? ondansetron (ZOFRAN ODT) 4 MG disintegrating tablet Take 1 tablet (4 mg total) by mouth every 6 (six) hours as needed for nausea. 20 tablet 0   ? promethazine (PHENERGAN) 12.5 MG tablet promethazine 12.5 mg tablet ? TAKE 1 TABLET BY MOUTH FOUR TIMES A DAY     ? sertraline (ZOLOFT) 100 MG tablet TAKE  2 TABLETS BY MOUTH EVERY DAY 60 tablet 0   ? ? ?Review of Systems ?Physical Exam  ? ?Blood pressure 125/73, pulse (!) 112, temperature 98.2 ?F (36.8 ?C), temperature source Oral, resp. rate 17, height 5\' 8"  (1.727 m), weight 102.1 kg, last menstrual period 08/21/2020, SpO2 98 %. ? ?Physical Exam ?Vitals and nursing note reviewed.  ?Constitutional:   ?   Appearance: She is well-developed.  ?Cardiovascular:  ?   Rate and Rhythm: Normal rate.  ?   Heart sounds: Normal heart sounds.  ?Abdominal:  ?   General: Bowel sounds are increased.  ?   Tenderness: There is no abdominal tenderness.  ?Musculoskeletal:  ?     Arms: ? ?Skin: ?   General: Skin is warm and dry.  ?   Capillary Refill: Capillary refill takes less than 2 seconds.  ?Neurological:  ?   Mental Status: She is alert.  ? ?Dilation: Closed ?Exam by:: [email or web address] RN  ? ?MAU Course  ?Procedures ?NST:  ?Baseline: 150  ?Variability: moderate ?Accelerations: ++  ?Decelerations: none ?Contractions: none ? ? ?MDM ?Will give  flexeril 5mg  ?Pain improved after flexeril. ? ?Assessment and Plan  ? ?1. [redacted] weeks gestation of pregnancy   ?2. Back pain affecting pregnancy in third trimester   ? ?Discharge to home ?Will give prescription for flexeril. ?No evidence of labor. ? ?Imagene Sheller ?05/26/2021, 9:48 AM  ?

## 2021-05-26 NOTE — MAU Note (Signed)
...  Cathy Bailey is a 26 y.o. at [redacted]w[redacted]d here in MAU reporting: HA, constant lower back pain, and intermittent lower abdominal cramping since midnight last night. She states she is unsure if the pain she is feeling is contractions. She states she did a lot of walking yesterday for her maternity pictures and is unsure if this is causing her pain. She took 500 mg of Tylenol at 0300 this morning and she states this decreased her HA but not her other pains. +FM. No VB or LOF.  ? ?Has not had her cervix checked yet in this pregnancy. ? ?Pain score:  ?6/10 HA ?7/10 lower abdomen ?7/10 lower abdomen ? ?FHT: 153 initial external ?Lab orders placed from triage:  UA ? ?

## 2021-05-28 LAB — OB RESULTS CONSOLE GBS: GBS: NEGATIVE

## 2021-06-02 ENCOUNTER — Telehealth (HOSPITAL_COMMUNITY): Payer: Self-pay | Admitting: *Deleted

## 2021-06-02 ENCOUNTER — Encounter (HOSPITAL_COMMUNITY): Payer: Self-pay | Admitting: *Deleted

## 2021-06-02 NOTE — Telephone Encounter (Signed)
Preadmission screen  

## 2021-06-07 ENCOUNTER — Other Ambulatory Visit: Payer: Self-pay | Admitting: Psychiatry

## 2021-06-07 DIAGNOSIS — F411 Generalized anxiety disorder: Secondary | ICD-10-CM

## 2021-06-07 DIAGNOSIS — F32A Depression, unspecified: Secondary | ICD-10-CM

## 2021-06-10 NOTE — Telephone Encounter (Signed)
Please call patient to schedule an appt. Last seen 11/4 with RTC in 6 weeks; canceled that appt.  ?

## 2021-06-11 ENCOUNTER — Telehealth: Payer: Self-pay | Admitting: Psychiatry

## 2021-06-11 NOTE — Telephone Encounter (Signed)
Patient called in for refill on Setraline 100mg . Ph: [phone]. Appt 4/28. Pharmacy [facility] [employer] [address] [place] ?

## 2021-06-11 NOTE — Telephone Encounter (Signed)
LVM to call and schedule an appt

## 2021-06-11 NOTE — Telephone Encounter (Signed)
Sent, patient was past due for an appt.  ?

## 2021-06-15 NOTE — H&P (Addendum)
OB History and Physical ?Preadmission H&P for scheduled IOL ? ?Cathy Bailey is a 26 y.o. female G1P0 presenting for scheduled IOL at [redacted]w[redacted]d. ? ?Pregnancy course is notable for bilateral renal pelviectasis that was also seen at MFM. 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 These results were discussed with MFM over the phone and continued surveillance was recommended. ? ?Rh negative (FOB confirmed negative and thus no Rhogam given), GBS negative 05/28/21, panorama low risk. ? ? ? ?OB History   ? ? Gravida  ?1  ? Para  ?0  ? Term  ?0  ? Preterm  ?0  ? AB  ?0  ? Living  ?0  ?  ? ? SAB  ?0  ? IAB  ?0  ? Ectopic  ?0  ? Multiple  ?0  ? Live Births  ?   ?   ?  ?  ? ?Past Medical History:  ?Diagnosis Date  ? Anemia   ? Anxiety   ? Dysmenorrhea 07/05/2012  ? Encounter for menstrual extraction 01/22/2014  ? Irregular bleeding 05/24/2014  ? Irregular periods 04/03/2015  ? Migraines   ? Seasonal allergies   ? ?Past Surgical History:  ?Procedure Laterality Date  ? TONSILLECTOMY AND ADENOIDECTOMY    ? WISDOM TOOTH EXTRACTION    ? ?Family History: family history includes Atrial fibrillation in her maternal grandfather and paternal grandmother; Cancer (age of onset: 12) in her maternal grandmother; Congestive Heart Failure in her maternal grandfather; Hyperlipidemia in her maternal grandmother; Hypertension in her father, maternal grandfather, paternal grandfather, and paternal grandmother; Macular degeneration in her maternal grandfather; Thyroid disease in her paternal grandmother. ?Social History:  reports that she has never smoked. She has never used smokeless tobacco. She reports that she does not currently use alcohol after a past usage of about 3.0 - 4.0 standard drinks per week. She reports that she does not use  drugs. ? ? ?  ?Maternal Diabetes: No ?Genetic Screening: Normal ?Maternal Ultrasounds/Referrals: Normal ?Fetal Ultrasounds or other Referrals:  Other:  urinary tract dilation ?Maternal Substance Abuse:  No ?Significant Maternal Medications:  sertraline ?Significant Maternal Lab Results:  Group B Strep negative and Rh negative ?Other Comments:  None ? ?Review of Systems - Patient denies fever, chills, SOB, CP, N/V/D.  ?History ?Dilation: Fingertip ?Effacement (%): 50 ?Station: -3 ?Exam by:: Mickle Mallory, RN ?Blood pressure 124/89, pulse 86, temperature 97.9 ?F (36.6 ?C), temperature source Oral, resp. rate 17, height 5\' 7"  (1.702 m), weight 107 kg, last menstrual period 08/21/2020. ?Exam ?Physical Exam  ? ?Gen: alert, well appearing, no distress ?Chest: nonlabored breathing ?CV: no peripheral edema ?Abdomen: soft, gravid  ?Ext: no evidence of DVT ? ?Prenatal labs: ?ABO, Rh: --/--/O NEG (04/17 0030) ?Antibody: NEG (04/17 0030) ?Rubella: Immune (09/20 0000) ?RPR: Nonreactive (09/20 0000)  ?HBsAg: Negative (09/20 0000)  ?HIV: Non-reactive (09/20 0000)  ?GBS: Negative/-- (03/29 0000)  ? ?Assessment/Plan: ?Admit to Labor and Delivery ?Cytotec for cervical ripening, followed by pitocin, AROM ?Epidural when desired ?Fetal ultrasound following delivery for urinary tract dilatation  ? ?07-10-1984 ?06/16/2021, 9:00 AM ? ? ? ? ?

## 2021-06-16 ENCOUNTER — Inpatient Hospital Stay (HOSPITAL_COMMUNITY): Payer: BC Managed Care – PPO | Admitting: Anesthesiology

## 2021-06-16 ENCOUNTER — Inpatient Hospital Stay (HOSPITAL_COMMUNITY): Payer: BC Managed Care – PPO

## 2021-06-16 ENCOUNTER — Encounter (HOSPITAL_COMMUNITY): Payer: Self-pay | Admitting: Obstetrics and Gynecology

## 2021-06-16 ENCOUNTER — Inpatient Hospital Stay (HOSPITAL_COMMUNITY)
Admission: AD | Admit: 2021-06-16 | Discharge: 2021-06-18 | DRG: 807 | Disposition: A | Discharge status: Home / Self Care | Payer: BC Managed Care – PPO | Attending: Obstetrics and Gynecology | Admitting: Obstetrics and Gynecology

## 2021-06-16 ENCOUNTER — Other Ambulatory Visit: Payer: Self-pay

## 2021-06-16 DIAGNOSIS — Z6791 Unspecified blood type, Rh negative: Secondary | ICD-10-CM | POA: Diagnosis not present

## 2021-06-16 DIAGNOSIS — Z3A39 39 weeks gestation of pregnancy: Secondary | ICD-10-CM

## 2021-06-16 DIAGNOSIS — O26893 Other specified pregnancy related conditions, third trimester: Principal | ICD-10-CM | POA: Diagnosis present

## 2021-06-16 DIAGNOSIS — Z3490 Encounter for supervision of normal pregnancy, unspecified, unspecified trimester: Principal | ICD-10-CM

## 2021-06-16 LAB — RPR: RPR Ser Ql: NONREACTIVE

## 2021-06-16 LAB — TYPE AND SCREEN
ABO/RH(D): O NEG
Antibody Screen: NEGATIVE

## 2021-06-16 LAB — CBC
HCT: 32.7 % — ABNORMAL LOW (ref 36.0–46.0)
Hemoglobin: 10.4 g/dL — ABNORMAL LOW (ref 12.0–15.0)
MCH: 25.4 pg — ABNORMAL LOW (ref 26.0–34.0)
MCHC: 31.8 g/dL (ref 30.0–36.0)
MCV: 79.8 fL — ABNORMAL LOW (ref 80.0–100.0)
Platelets: 311 10*3/uL (ref 150–400)
RBC: 4.1 MIL/uL (ref 3.87–5.11)
RDW: 13.8 % (ref 11.5–15.5)
WBC: 13.6 10*3/uL — ABNORMAL HIGH (ref 4.0–10.5)
nRBC: 0 % (ref 0.0–0.2)

## 2021-06-16 MED ORDER — EPHEDRINE 5 MG/ML INJ: 10.0000 mg | INTRAVENOUS | Status: DC | PRN

## 2021-06-16 MED ORDER — TERBUTALINE SULFATE 1 MG/ML IJ SOLN: 0.2500 mg | Freq: Once | INTRAMUSCULAR | Status: DC | PRN

## 2021-06-16 MED ORDER — MISOPROSTOL 25 MCG QUARTER TABLET
25.0000 ug | ORAL_TABLET | ORAL | Status: DC | PRN
  Administered 2021-06-16 (×2): 25 ug via VAGINAL
  Filled 2021-06-16 (×2): qty 1

## 2021-06-16 MED ORDER — MISOPROSTOL 50MCG HALF TABLET
50.0000 ug | ORAL_TABLET | ORAL | Status: DC | PRN
  Administered 2021-06-16: 50 ug via ORAL

## 2021-06-16 MED ORDER — LIDOCAINE HCL (PF) 1 % IJ SOLN
INTRAMUSCULAR | Status: DC | PRN
  Administered 2021-06-16: 10 mL via EPIDURAL
  Administered 2021-06-16: 2 mL via EPIDURAL

## 2021-06-16 MED ORDER — OXYTOCIN-SODIUM CHLORIDE 30-0.9 UT/500ML-% IV SOLN
1.0000 m[IU]/min | INTRAVENOUS | Status: DC
  Administered 2021-06-16: 2 m[IU]/min via INTRAVENOUS

## 2021-06-16 MED ORDER — LACTATED RINGERS IV SOLN
INTRAVENOUS | Status: DC
  Administered 2021-06-16: 125 mL/h via INTRAVENOUS

## 2021-06-16 MED ORDER — PHENYLEPHRINE 40 MCG/ML (10ML) SYRINGE FOR IV PUSH (FOR BLOOD PRESSURE SUPPORT): 80.0000 ug | PREFILLED_SYRINGE | INTRAVENOUS | Status: DC | PRN

## 2021-06-16 MED ORDER — FENTANYL-BUPIVACAINE-NACL 0.5-0.125-0.9 MG/250ML-% EP SOLN
12.0000 mL/h | EPIDURAL | Status: DC | PRN
  Filled 2021-06-16: qty 250

## 2021-06-16 MED ORDER — OXYCODONE-ACETAMINOPHEN 5-325 MG PO TABS: 2.0000 | ORAL_TABLET | ORAL | Status: DC | PRN

## 2021-06-16 MED ORDER — LACTATED RINGERS IV SOLN: 500.0000 mL | INTRAVENOUS | Status: DC | PRN

## 2021-06-16 MED ORDER — LIDOCAINE HCL (PF) 1 % IJ SOLN: 30.0000 mL | INTRAMUSCULAR | Status: DC | PRN

## 2021-06-16 MED ORDER — LACTATED RINGERS IV SOLN
500.0000 mL | Freq: Once | INTRAVENOUS | Status: AC
  Administered 2021-06-16: 500 mL via INTRAVENOUS

## 2021-06-16 MED ORDER — FENTANYL CITRATE (PF) 100 MCG/2ML IJ SOLN
100.0000 ug | INTRAMUSCULAR | Status: DC | PRN
  Administered 2021-06-16 (×3): 100 ug via INTRAVENOUS
  Filled 2021-06-16 (×3): qty 2

## 2021-06-16 MED ORDER — OXYTOCIN BOLUS FROM INFUSION
333.0000 mL | Freq: Once | INTRAVENOUS | Status: AC
  Administered 2021-06-17: 333 mL via INTRAVENOUS

## 2021-06-16 MED ORDER — DIPHENHYDRAMINE HCL 50 MG/ML IJ SOLN: 12.5000 mg | INTRAMUSCULAR | Status: DC | PRN

## 2021-06-16 MED ORDER — OXYCODONE-ACETAMINOPHEN 5-325 MG PO TABS: 1.0000 | ORAL_TABLET | ORAL | Status: DC | PRN

## 2021-06-16 MED ORDER — ONDANSETRON HCL 4 MG/2ML IJ SOLN: 4.0000 mg | Freq: Four times a day (QID) | INTRAMUSCULAR | Status: DC | PRN

## 2021-06-16 MED ORDER — ACETAMINOPHEN 325 MG PO TABS: 650.0000 mg | ORAL_TABLET | ORAL | Status: DC | PRN

## 2021-06-16 MED ORDER — HYDROXYZINE HCL 50 MG PO TABS: 50.0000 mg | ORAL_TABLET | Freq: Four times a day (QID) | ORAL | Status: DC | PRN

## 2021-06-16 MED ORDER — FENTANYL-BUPIVACAINE-NACL 0.5-0.125-0.9 MG/250ML-% EP SOLN
EPIDURAL | Status: DC | PRN
Start: 2021-06-16 — End: 2021-06-17
  Administered 2021-06-16: 12 mL/h via EPIDURAL

## 2021-06-16 MED ORDER — SOD CITRATE-CITRIC ACID 500-334 MG/5ML PO SOLN: 30.0000 mL | ORAL | Status: DC | PRN

## 2021-06-16 MED ORDER — OXYTOCIN-SODIUM CHLORIDE 30-0.9 UT/500ML-% IV SOLN
2.5000 [IU]/h | INTRAVENOUS | Status: DC
  Filled 2021-06-16: qty 500

## 2021-06-16 MED ORDER — BUTORPHANOL TARTRATE 1 MG/ML IJ SOLN: 1.0000 mg | INTRAMUSCULAR | Status: DC | PRN

## 2021-06-16 NOTE — Anesthesia Preprocedure Evaluation (Signed)
Anesthesia Evaluation  ?Patient identified by MRN, date of birth, ID band ?Patient awake ? ? ? ?Reviewed: ?Allergy & Precautions, Patient's Chart, lab work & pertinent test results ? ?Airway ?Mallampati: II ? ?TM Distance: >3 FB ?Neck ROM: Full ? ? ? Dental ?no notable dental hx. ? ?  ?Pulmonary ?neg pulmonary ROS,  ?  ?Pulmonary exam normal ?breath sounds clear to auscultation ? ? ? ? ? ? Cardiovascular ?negative cardio ROS ?Normal cardiovascular exam ?Rhythm:Regular Rate:Normal ? ? ?  ?Neuro/Psych ? Headaches, PSYCHIATRIC DISORDERS Anxiety   ? GI/Hepatic ?negative GI ROS, Neg liver ROS,   ?Endo/Other  ?BMI 37 ? Renal/GU ?negative Renal ROS  ?negative genitourinary ?  ?Musculoskeletal ?negative musculoskeletal ROS ?(+)  ? Abdominal ?  ?Peds ?negative pediatric ROS ?(+)  Hematology ?negative hematology ROS ?(+) Hb 10.4, plt 311   ?Anesthesia Other Findings ? ? Reproductive/Obstetrics ?(+) Pregnancy ? ?  ? ? ? ? ? ? ? ? ? ? ? ? ? ?  ?  ? ? ? ? ? ? ? ? ?Anesthesia Physical ?Anesthesia Plan ? ?ASA: 2 ? ?Anesthesia Plan: Epidural  ? ?Post-op Pain Management:   ? ?Induction:  ? ?PONV Risk Score and Plan: 2 ? ?Airway Management Planned: Natural Airway ? ?Additional Equipment: None ? ?Intra-op Plan:  ? ?Post-operative Plan:  ? ?Informed Consent: I have reviewed the patients History and Physical, chart, labs and discussed the procedure including the risks, benefits and alternatives for the proposed anesthesia with the patient or authorized representative who has indicated his/her understanding and acceptance.  ? ? ? ? ? ?Plan Discussed with:  ? ?Anesthesia Plan Comments:   ? ? ? ? ? ? ?Anesthesia Quick Evaluation ? ?

## 2021-06-16 NOTE — Progress Notes (Signed)
Foley balloon out, but head high and ballotable.  Continued to titrate pit and patient seated upright. ? ?Head better applied and cervix 4-5/60/-2, AROM completed in standard fashion with return of clear fluid. ? ?FHT cat 1. Continue IOL. ? ?Alpha Gula ?

## 2021-06-16 NOTE — Anesthesia Procedure Notes (Signed)
Epidural ?Patient location during procedure: OB ?Start time: 06/16/2021 7:42 PM ?End time: 06/16/2021 7:49 PM ? ?Staffing ?Anesthesiologist: Lannie Fields, DO ?Performed: anesthesiologist  ? ?Preanesthetic Checklist ?Completed: patient identified, IV checked, risks and benefits discussed, monitors and equipment checked, pre-op evaluation and timeout performed ? ?Epidural ?Patient position: sitting ?Prep: DuraPrep and site prepped and draped ?Patient monitoring: continuous pulse ox, blood pressure, heart rate and cardiac monitor ?Approach: midline ?Location: L3-L4 ?Injection technique: LOR air ? ?Needle:  ?Needle type: Tuohy  ?Needle gauge: 17 G ?Needle length: 9 cm ?Needle insertion depth: 7 cm ?Catheter type: closed end flexible ?Catheter size: 19 Gauge ?Catheter at skin depth: 12 cm ?Test dose: negative ? ?Assessment ?Sensory level: T8 ?Events: blood not aspirated, injection not painful, no injection resistance, no paresthesia and negative IV test ? ?Additional Notes ?Patient identified. Risks/Benefits/Options discussed with patient including but not limited to bleeding, infection, nerve damage, paralysis, failed block, incomplete pain control, headache, blood pressure changes, nausea, vomiting, reactions to medication both or allergic, itching and postpartum back pain. Confirmed with bedside nurse the patient's most recent platelet count. Confirmed with patient that they are not currently taking any anticoagulation, have any bleeding history or any family history of bleeding disorders. Patient expressed understanding and wished to proceed. All questions were answered. Sterile technique was used throughout the entire procedure. Please see nursing notes for vital signs. Test dose was given through epidural catheter and negative prior to continuing to dose epidural or start infusion. Warning signs of high block given to the patient including shortness of breath, tingling/numbness in hands, complete motor  block, or any concerning symptoms with instructions to call for help. Patient was given instructions on fall risk and not to get out of bed. All questions and concerns addressed with instructions to call with any issues or inadequate analgesia.  Reason for block:procedure for pain ? ? ? ?

## 2021-06-16 NOTE — Progress Notes (Signed)
Labor Progress Note ? ?IOL on unripe cervix at 39 weeks, she is s/p vaginal misoprostol x 2 and buccal misoprostol with minimal change.  Cervix 0.5 / 50 / -3.  Discussed foley balloon, pt accepted and was placed with uterine balloon inflated. ? ?FHT cat 1 ? ?Continue IOL. ? ?Alpha Gula ?

## 2021-06-17 ENCOUNTER — Encounter (HOSPITAL_COMMUNITY): Payer: Self-pay | Admitting: Obstetrics and Gynecology

## 2021-06-17 LAB — CBC
HCT: 31.3 % — ABNORMAL LOW (ref 36.0–46.0)
Hemoglobin: 9.9 g/dL — ABNORMAL LOW (ref 12.0–15.0)
MCH: 25.1 pg — ABNORMAL LOW (ref 26.0–34.0)
MCHC: 31.6 g/dL (ref 30.0–36.0)
MCV: 79.4 fL — ABNORMAL LOW (ref 80.0–100.0)
Platelets: 314 10*3/uL (ref 150–400)
RBC: 3.94 MIL/uL (ref 3.87–5.11)
RDW: 14.1 % (ref 11.5–15.5)
WBC: 21.8 10*3/uL — ABNORMAL HIGH (ref 4.0–10.5)
nRBC: 0 % (ref 0.0–0.2)

## 2021-06-17 MED ORDER — SIMETHICONE 80 MG PO CHEW: 80.0000 mg | CHEWABLE_TABLET | ORAL | Status: DC | PRN

## 2021-06-17 MED ORDER — ACETAMINOPHEN 325 MG PO TABS: 650.0000 mg | ORAL_TABLET | ORAL | Status: DC | PRN

## 2021-06-17 MED ORDER — COCONUT OIL OIL: 1.0000 "application " | TOPICAL_OIL | Status: DC | PRN

## 2021-06-17 MED ORDER — ONDANSETRON HCL 4 MG PO TABS: 4.0000 mg | ORAL_TABLET | ORAL | Status: DC | PRN

## 2021-06-17 MED ORDER — ZOLPIDEM TARTRATE 5 MG PO TABS
5.0000 mg | ORAL_TABLET | Freq: Every evening | ORAL | Status: DC | PRN
Start: 2021-06-17 — End: 2021-06-18

## 2021-06-17 MED ORDER — WITCH HAZEL-GLYCERIN EX PADS
1.0000 "application " | MEDICATED_PAD | CUTANEOUS | Status: DC | PRN
  Administered 2021-06-17: 1 via TOPICAL

## 2021-06-17 MED ORDER — PRENATAL MULTIVITAMIN CH
1.0000 | ORAL_TABLET | Freq: Every day | ORAL | Status: DC
  Administered 2021-06-17 – 2021-06-18 (×2): 1 via ORAL
  Filled 2021-06-17 (×2): qty 1

## 2021-06-17 MED ORDER — IBUPROFEN 600 MG PO TABS
600.0000 mg | ORAL_TABLET | Freq: Four times a day (QID) | ORAL | Status: DC
  Administered 2021-06-17 – 2021-06-18 (×6): 600 mg via ORAL
  Filled 2021-06-17 (×6): qty 1

## 2021-06-17 MED ORDER — SENNOSIDES-DOCUSATE SODIUM 8.6-50 MG PO TABS
2.0000 | ORAL_TABLET | ORAL | Status: DC
  Filled 2021-06-17: qty 2

## 2021-06-17 MED ORDER — DIBUCAINE (PERIANAL) 1 % EX OINT: 1.0000 "application " | TOPICAL_OINTMENT | CUTANEOUS | Status: DC | PRN

## 2021-06-17 MED ORDER — DIPHENHYDRAMINE HCL 25 MG PO CAPS: 25.0000 mg | ORAL_CAPSULE | Freq: Four times a day (QID) | ORAL | Status: DC | PRN

## 2021-06-17 MED ORDER — ONDANSETRON HCL 4 MG/2ML IJ SOLN: 4.0000 mg | INTRAMUSCULAR | Status: DC | PRN

## 2021-06-17 MED ORDER — TETANUS-DIPHTH-ACELL PERTUSSIS 5-2.5-18.5 LF-MCG/0.5 IM SUSY: 0.5000 mL | PREFILLED_SYRINGE | Freq: Once | INTRAMUSCULAR | Status: DC

## 2021-06-17 MED ORDER — BENZOCAINE-MENTHOL 20-0.5 % EX AERO
1.0000 "application " | INHALATION_SPRAY | CUTANEOUS | Status: DC | PRN
  Administered 2021-06-17: 1 via TOPICAL
  Filled 2021-06-17: qty 56

## 2021-06-17 NOTE — Progress Notes (Signed)
Post Partum Day 0 ?Subjective: ?no complaints, up ad lib, voiding, and tolerating PO ? ?Objective: ?Blood pressure (!) 111/56, pulse 89, temperature 99 ?F (37.2 ?C), temperature source Oral, resp. rate 16, height 5\' 7"  (1.702 m), weight 107 kg, last menstrual period 08/21/2020, SpO2 99 %, unknown if currently breastfeeding. ? ?Physical Exam:  ?General: alert, cooperative, appears stated age, and no distress ?Lochia: appropriate ?Uterine Fundus: firm ?Incision:   ?DVT Evaluation: No evidence of DVT seen on physical exam. ? ?Recent Labs  ?  06/16/21 ?06/18/21 06/17/21 ?0434  ?HGB 10.4* 9.9*  ?HCT 32.7* 31.3*  ? ? ?Assessment/Plan: ?Plan for discharge tomorrow, Breastfeeding, and Circumcision prior to discharge ? ? LOS: 1 day  ? ?06/19/21 ?06/17/2021, 10:09 AM  ? ? ?

## 2021-06-17 NOTE — Anesthesia Postprocedure Evaluation (Signed)
Anesthesia Post Note ? ?Patient: Cathy Bailey ? ?Procedure(s) Performed: AN AD HOC LABOR EPIDURAL ? ?  ? ?Patient location during evaluation: Mother Baby ?Anesthesia Type: Epidural ?Level of consciousness: awake ?Pain management: satisfactory to patient ?Vital Signs Assessment: post-procedure vital signs reviewed and stable ?Respiratory status: spontaneous breathing ?Cardiovascular status: stable ?Anesthetic complications: no ? ? ?No notable events documented. ? ?Last Vitals:  ?Vitals:  ? 06/17/21 0840 06/17/21 1239  ?BP: (!) 111/56 131/84  ?Pulse: 89 90  ?Resp: 16 16  ?Temp: 37.2 ?C 36.8 ?C  ?SpO2:    ?  ?Last Pain:  ?Vitals:  ? 06/17/21 1239  ?TempSrc: Oral  ?PainSc: 0-No pain  ? ?Pain Goal:   ? ?  ?  ?  ?  ?  ?  ?  ? ?Cathy Bailey ? ? ? ? ?

## 2021-06-17 NOTE — Lactation Note (Signed)
This note was copied from a baby's chart. ?Lactation Consultation Note ? ?Patient Name: Cathy Bailey ?Today's Date: 06/17/2021 ?Reason for consult: Initial assessment;1st time breastfeeding;Mother's request;Difficult latch ?Age:26 hours ?P1, term female infant. ?Per mom, she mostly been using DEBP and giving infant back her EBM, she been having  trouble getting infant to sustain latch. ?LC suggested mom latch infant at breast skin to skin and do breast stimulation to keep infant actively BF, ask for latch assistance if needed.  ?Mom latched infant on her right breast using the cradle hold position, infant latched with depth, swallows heard, infant BF for 20 minutes, sustaining latch. ?LC did not assist mom with latch when she switched to cradle hold position on her left breast, mom successfully latched infant without assistance and infant was still BF when LC left the room. ?Mom's plan: ?1- Going forward mom latch infant at the breast  for every feeding by demand, 8 to 12+ or more times within 24 hours, STS. ?2- Mom will ask for RN/LC for further latch assistance if needed. ?3- Mom can continue to pump if she chooses or pump and give infant back her EBM, if he doesn't latch. ?Maternal Data ?Does the patient have breastfeeding experience prior to this delivery?: No ? ?Feeding ?Mother's Current Feeding Choice: Breast Milk ?Nipple Type: Slow - flow ? ?LATCH Score ?Latch: Grasps breast easily, tongue down, lips flanged, rhythmical sucking. ? ?Audible Swallowing: Spontaneous and intermittent ? ?Type of Nipple: Everted at rest and after stimulation ? ?Comfort (Breast/Nipple): Soft / non-tender ? ?Hold (Positioning): Assistance needed to correctly position infant at breast and maintain latch. ? ?LATCH Score: 9 ? ? ?Lactation Tools Discussed/Used ?Tools: Pump ?Breast pump type: Double-Electric Breast Pump ?Pump Education: Setup, frequency, and cleaning;Milk Storage ?Reason for Pumping: Infant was not latching previously  on MBU ?Pumping frequency: Mom has been pumping every 3 hours for 15 minutes on inital setting. ?Pumped volume: 10 mL ? ?Interventions ?Interventions: Breast feeding basics reviewed;Assisted with latch;Skin to skin;Breast compression;Adjust position;Support pillows;Position options;Expressed milk;DEBP;LC Services brochure;Education ? ?Discharge ?  ? ?Consult Status ?Consult Status: Follow-up ?Date: 06/18/21 ?Follow-up type: In-patient ? ? ? ?Vicente Serene ?06/17/2021, 5:04 PM ? ? ? ?

## 2021-06-17 NOTE — Plan of Care (Signed)
?  Problem: Education: ?Goal: Knowledge of General Education information will improve ?Description: Including pain rating scale, medication(s)/side effects and non-pharmacologic comfort measures ?Outcome: Completed/Met ?  ?

## 2021-06-17 NOTE — Lactation Note (Signed)
This note was copied from a baby's chart. ?Lactation Consultation Note ? ?Patient Name: Cathy Bailey ?Today's Date: 06/17/2021 ?Reason for consult: L&D Initial assessment ?Age:26 hours ?P1, term female infant. ?Mom latched infant on her right breast using the football hold position, infant was on and off breast, breastfeed for 12 minutes. ?Afterwards mom was doing skin to skin with infant as LC left the room. ?Mom knows to breastfeed infant on demand, 8 to 12+ or more times within 24 hours, STS. ?Mom knows to ask RN/LC for further latch assistance of MBU if needed. ?LC congratulated parents on the birth of their son.  ?Maternal Data ?  ? ?Feeding ?Mother's Current Feeding Choice: Breast Milk ? ?LATCH Score ?Latch: Repeated attempts needed to sustain latch, nipple held in mouth throughout feeding, stimulation needed to elicit sucking reflex. ? ?Audible Swallowing: A few with stimulation ? ?Type of Nipple: Everted at rest and after stimulation ? ?Comfort (Breast/Nipple): Soft / non-tender ? ?Hold (Positioning): Assistance needed to correctly position infant at breast and maintain latch. ? ?LATCH Score: 7 ? ? ?Lactation Tools Discussed/Used ?  ? ?Interventions ?Interventions: Assisted with latch;Skin to skin;Breast compression;Adjust position;Support pillows;Education ? ?Discharge ?  ? ?Consult Status ?Consult Status: Follow-up from L&D ? ? ? ?Danelle Earthly ?06/17/2021, 1:26 AM ? ? ? ?

## 2021-06-17 NOTE — Lactation Note (Deleted)
This note was copied from a baby's chart. ?Lactation Consultation Note ? ?Patient Name: Cathy Bailey ?Today's Date: 06/17/2021 ?Reason for consult: Follow-up assessment;Difficult latch ?Age:26 years ?Per mom, infant has been sleepy and not latching well at the breast today,  infant is not sustaining latch or only hold's nipple in mouth not sucking at the breast. ?Mom using a 20 mm NS, that was given to her earlier today by RN, per mom , she used one with her 1st child. ?Mom attempted to latch infant on her right breast using the cradle hold position, infant only help nipple in mouth and was sleepy. ?Mom hand expressed 7 mls of colostrum that was spoon fed to infant. ?LC suggested mom pre-pump breast and then latch infant before using 20 mm NS, if infant doesn't;t latch then apply 20 mm NS, see mom's nipple type below in Latch score.  ?LC set up mom with DEBP due to using NS, mom had expressed 7 mls of colostrum in breast flange and was still pumping when LC left the room. ?Mom's plan: ?1- Continue to BF infant on demand, 8 to 12+ or more times within 24 hours, STS. ?2- Attempt to pre-pump breast with hand pump and latch infant first before apply 20 mm NS. ?3- If infant doesn't latch, mom will continue to offer her EBM that she pumped or hand express and ask for latch assistance if needed. ?4-Mom will pump every 3 hours for 15 minutes on initial setting. ?Maternal Data ?Does the patient have breastfeeding experience prior to this delivery?: No ? ?Feeding ?Mother's Current Feeding Choice: Breast Milk ?Nipple Type: Slow - flow ? ?LATCH Score ?Latch: Too sleepy or reluctant, no latch achieved, no sucking elicited. ? ?Audible Swallowing: None ? ?Type of Nipple: Everted at rest and after stimulation (slighty short shafted) ? ?Comfort (Breast/Nipple): Soft / non-tender ? ?Hold (Positioning): Assistance needed to correctly position infant at breast and maintain latch. ? ?LATCH Score: 5 ? ? ?Lactation Tools  Discussed/Used ?Tools: Nipple Dorris Carnes ?Nipple shield size: 20 (Mom given 20 mm NS earlier today she used one with her son in her 26 years pregnancy.) ?Breast pump type: Double-Electric Breast Pump ?Pump Education: Setup, frequency, and cleaning;Milk Storage ?Reason for Pumping: LC set up mom with DEBP due using 20 mm NS and infant currently no latching at breast. ?Pumping frequency: Mom has been pumping every 3 hours for 15 minutes on inital setting. ?Pumped volume: 7 mL (Mom was still pumping when LC left the room.) ? ?Interventions ?Interventions: Assisted with latch;Skin to skin;Pre-pump if needed;Support pillows;Adjust position;Breast compression;Position options;Expressed milk;DEBP;Education ? ?Discharge ?  ? ?Consult Status ?Consult Status: Follow-up ?Date: 06/25/21 ?Follow-up type: In-patient ? ? ? ?Danelle Earthly ?06/17/2021, 5:48 PM ? ? ? ?

## 2021-06-18 LAB — BIRTH TISSUE RECOVERY COLLECTION (PLACENTA DONATION)

## 2021-06-18 MED ORDER — SUCROSE 24% NICU/PEDS ORAL SOLUTION: 0.5000 mL | OROMUCOSAL | Status: DC | PRN

## 2021-06-18 MED ORDER — ACETAMINOPHEN FOR CIRCUMCISION 160 MG/5 ML: 40.0000 mg | Freq: Once | ORAL | Status: DC

## 2021-06-18 MED ORDER — LIDOCAINE 1% INJECTION FOR CIRCUMCISION: 0.8000 mL | INJECTION | Freq: Once | INTRAVENOUS | Status: DC

## 2021-06-18 MED ORDER — ACETAMINOPHEN FOR CIRCUMCISION 160 MG/5 ML: 40.0000 mg | ORAL | Status: DC | PRN

## 2021-06-18 MED ORDER — WHITE PETROLATUM EX OINT: 1.0000 "application " | TOPICAL_OINTMENT | CUTANEOUS | Status: DC | PRN

## 2021-06-18 MED ORDER — EPINEPHRINE TOPICAL FOR CIRCUMCISION 0.1 MG/ML: 1.0000 [drp] | TOPICAL | Status: DC | PRN

## 2021-06-18 NOTE — Discharge Summary (Signed)
? ?  Postpartum Discharge Summary ? ?Date of Service June 18, 2021 ? ?   ?Patient Name: Cathy Bailey ?DOB: 1995-03-06 ?MRN: [id number] ? ?Date of admission: 06/16/2021 ?Delivery date:06/17/2021  ?Delivering provider: Eyvonne Mechanic A  ?Date of discharge: 06/18/2021 ? ?Admitting diagnosis: Pregnancy [Z34.90] ?Intrauterine pregnancy: [redacted]w[redacted]d    ?Secondary diagnosis:  Principal Problem: ?  Pregnancy ? ?Additional problems: none    ?Discharge diagnosis: Term Pregnancy Delivered                                              ?Post partum procedures: not applicable ?Augmentation: AROM, Pitocin, Cytotec, and IP Foley ?Complications: None ? ?Hospital course: Induction of Labor With Vaginal Delivery   ?26y.o. yo G1P1001 at 371w3das admitted to the hospital 06/16/2021 for induction of labor.  Indication for induction - elective  Patient had an uncomplicated labor course as follows: ?Membrane Rupture Time/Date: 6:46 PM ,06/16/2021   ?Delivery Method:Vaginal, Spontaneous  ?Episiotomy: None  ?Lacerations:  2nd degree  ?Details of delivery can be found in separate delivery note.  Patient had a routine postpartum course. Patient is discharged home 06/18/21. ? ?Newborn Data: ?Birth date:06/17/2021  ?Birth time:12:33 AM  ?Gender:Female  ?Living status:Living  ?Apgars:9 ,9  ?Weight:3440 g  ? ?Magnesium Sulfate received: No ?BMZ received: No ?Rhophylac:N/A ?MMR:N/A ?T-DaP:Given prenatally ?Flu: N/A ?Transfusion:No ? ?Physical exam  ?Vitals:  ? 06/17/21 1800 06/17/21 2213 06/18/21 0500 06/18/21 0619  ?BP: 121/72 125/76 (!) 121/95 129/80  ?Pulse: 80 79 83 72  ?Resp: _0 ?Temp:  98.1 ?F (36.7 ?C) 97.8 ?F (36.6 ?C)   ?TempSrc:  Oral Oral   ?SpO2:  99%    ?Weight:      ?Height:      ? ?General: alert, cooperative, and no distress ?Lochia: appropriate ?Uterine Fundus: firm ?Incision: Healing well with no significant drainage ?DVT Evaluation: No evidence of DVT seen on physical exam. ?Labs: ?Lab Results  ?Component Value Date  ? WBC 21.8  (H) 06/17/2021  ? HGB 9.9 (L) 06/17/2021  ? HCT 31.3 (L) 06/17/2021  ? MCV 79.4 (L) 06/17/2021  ? PLT 314 06/17/2021  ? ? ?  Latest Ref Rng & Units 05/01/2021  ?  2:58 PM  ?CMP  ?Glucose 70 - 99 mg/dL 97    ?BUN 6 - 20 mg/dL 5    ?Creatinine 0.44 - 1.00 mg/dL 0.49    ?Sodium 135 - 145 mmol/L 136    ?Potassium 3.5 - 5.1 mmol/L 3.6    ?Chloride 98 - 111 mmol/L 104    ?CO2 22 - 32 mmol/L 21    ?Calcium 8.9 - 10.3 mg/dL 8.4    ?Total Protein 6.5 - 8.1 g/dL 6.2    ?Total Bilirubin 0.3 - 1.2 mg/dL 0.2    ?Alkaline Phos 38 - 126 U/L 85    ?AST 15 - 41 U/L 17    ?ALT 0 - 44 U/L 12    ? ?Edinburgh Score: ? ?  06/17/2021  ?  2:30 AM  ?EdFlavia Shipperostnatal Depression Scale Screening Tool  ?I have been able to laugh and see the funny side of things. 0  ?I have looked forward with enjoyment to things. 0  ?I have blamed myself unnecessarily when things went wrong. 0  ?I have been anxious or worried for no good reason. 0  ?I have  felt scared or panicky for no good reason. 0  ?Things have been getting on top of me. 0  ?I have been so unhappy that I have had difficulty sleeping. 0  ?I have felt sad or miserable. 0  ?I have been so unhappy that I have been crying. 0  ?The thought of harming myself has occurred to me. 0  ?Edinburgh Postnatal Depression Scale Total 0  ? ? ? ? ?After visit meds:  ?Allergies as of 06/18/2021   ?No Known Allergies ?  ? ?  ?Medication List  ?  ? ?TAKE these medications   ? ?buPROPion 150 MG 24 hr tablet ?Commonly known as: WELLBUTRIN XL ?TAKE 1 TABLET BY MOUTH EVERY DAY ?  ?cyclobenzaprine 10 MG tablet ?Commonly known as: FLEXERIL ?Take 0.5-1 tablets (5-10 mg total) by mouth 3 (three) times daily as needed for muscle spasms. ?  ?ondansetron 4 MG disintegrating tablet ?Commonly known as: Zofran ODT ?Take 1 tablet (4 mg total) by mouth every 6 (six) hours as needed for nausea. ?  ?pantoprazole 20 MG tablet ?Commonly known as: PROTONIX ?Take 20 mg by mouth 2 (two) times daily. ?  ?PRENATAL PO ?Take 1 tablet by  mouth daily. ?  ?sertraline 100 MG tablet ?Commonly known as: ZOLOFT ?TAKE 2 TABLETS BY MOUTH EVERY DAY ?  ? ?  ? ? ? ?Discharge home in stable condition ?Infant Feeding: Breast ?Infant Disposition:home with mother ?Discharge instruction: per After Visit Summary and Postpartum booklet. ?Activity: Advance as tolerated. Pelvic rest for 6 weeks.  ?Diet: routine diet ?Anticipated Birth Control: Unsure ?Postpartum Appointment:6 weeks ?Additional Postpartum F/U:  not applicable ?Future Appointments: ?Future Appointments  ?Date Time Provider [place]  ?06/27/2021 10:00 AM Thayer Headings, PMHNP CP-CP None  ? ?Follow up Visit: ? ? ?  ? ?06/18/2021 ?Cyril Mourning, MD ? ? ?

## 2021-06-18 NOTE — Social Work (Signed)
MOB was referred for history of anxiety. ? ?* Referral screened out by Clinical Social Worker because none of the following criteria appear to apply: ?~ History of anxiety/depression during this pregnancy, or of post-partum depression following prior delivery. ?~ Diagnosis of anxiety and/or depression within last 3 years ?OR ?* MOB's symptoms currently being treated with medication and/or therapy. Per chart review, MOB sees Psychiatrist. She takes Sertraline, Wellbutrin and states that it is helpful with mood and anxiety.  ? ?Please contact the Clinical Social Worker if needs arise, by [facility] request, or if MOB scores greater than 9/yes to question 10 on Edinburgh Postpartum Depression Screen.  ? ?Vivi Barrack, MSW, LCSW ?[facility]  ?Clinical Social Worker  ?[phone] ?06/18/2021  9:01 AM  ?

## 2021-06-26 ENCOUNTER — Telehealth (HOSPITAL_COMMUNITY): Payer: Self-pay | Admitting: *Deleted

## 2021-06-26 NOTE — Telephone Encounter (Signed)
Left phone voicemail message. ? ?Odis Hollingshead, RN 06-26-2021 at 2:58pm ?

## 2021-06-27 ENCOUNTER — Ambulatory Visit: Payer: Medicaid Other | Admitting: Psychiatry

## 2021-08-05 ENCOUNTER — Telehealth: Payer: Self-pay

## 2021-08-05 NOTE — Telephone Encounter (Signed)
Open in arrow

## 2021-09-22 ENCOUNTER — Other Ambulatory Visit: Payer: Self-pay | Admitting: Psychiatry

## 2021-09-22 DIAGNOSIS — F411 Generalized anxiety disorder: Secondary | ICD-10-CM

## 2021-09-22 NOTE — Telephone Encounter (Signed)
Please schedule appt

## 2021-09-23 NOTE — Telephone Encounter (Signed)
Pt going to a different  provider. Please cancel script

## 2021-09-24 ENCOUNTER — Telehealth: Payer: Self-pay | Admitting: Neurology

## 2021-09-24 NOTE — Telephone Encounter (Signed)
error 

## 2022-02-09 ENCOUNTER — Encounter (HOSPITAL_COMMUNITY): Payer: Self-pay

## 2022-02-09 ENCOUNTER — Other Ambulatory Visit: Payer: Self-pay

## 2022-02-09 ENCOUNTER — Emergency Department (HOSPITAL_COMMUNITY)
Admission: EM | Admit: 2022-02-09 | Discharge: 2022-02-09 | Discharge status: Against Medical Advice | Payer: Medicaid Other | Attending: Emergency Medicine | Admitting: Emergency Medicine

## 2022-02-09 ENCOUNTER — Emergency Department (HOSPITAL_COMMUNITY): Payer: Medicaid Other

## 2022-02-09 DIAGNOSIS — K625 Hemorrhage of anus and rectum: Secondary | ICD-10-CM | POA: Diagnosis present

## 2022-02-09 DIAGNOSIS — K644 Residual hemorrhoidal skin tags: Secondary | ICD-10-CM | POA: Insufficient documentation

## 2022-02-09 DIAGNOSIS — K649 Unspecified hemorrhoids: Secondary | ICD-10-CM

## 2022-02-09 LAB — CBC WITH DIFFERENTIAL/PLATELET
Abs Immature Granulocytes: 0.03 10*3/uL (ref 0.00–0.07)
Basophils Absolute: 0.1 10*3/uL (ref 0.0–0.1)
Basophils Relative: 1 %
Eosinophils Absolute: 0.1 10*3/uL (ref 0.0–0.5)
Eosinophils Relative: 1 %
HCT: 39.7 % (ref 36.0–46.0)
Hemoglobin: 13.1 g/dL (ref 12.0–15.0)
Immature Granulocytes: 0 %
Lymphocytes Relative: 39 %
Lymphs Abs: 2.8 10*3/uL (ref 0.7–4.0)
MCH: 26.7 pg (ref 26.0–34.0)
MCHC: 33 g/dL (ref 30.0–36.0)
MCV: 81 fL (ref 80.0–100.0)
Monocytes Absolute: 0.5 10*3/uL (ref 0.1–1.0)
Monocytes Relative: 7 %
Neutro Abs: 3.6 10*3/uL (ref 1.7–7.7)
Neutrophils Relative %: 52 %
Platelets: 314 10*3/uL (ref 150–400)
RBC: 4.9 MIL/uL (ref 3.87–5.11)
RDW: 14.2 % (ref 11.5–15.5)
WBC: 7.1 10*3/uL (ref 4.0–10.5)
nRBC: 0 % (ref 0.0–0.2)

## 2022-02-09 LAB — COMPREHENSIVE METABOLIC PANEL
ALT: 20 U/L (ref 0–44)
AST: 19 U/L (ref 15–41)
Albumin: 4 g/dL (ref 3.5–5.0)
Alkaline Phosphatase: 83 U/L (ref 38–126)
Anion gap: 7 (ref 5–15)
BUN: 10 mg/dL (ref 6–20)
CO2: 23 mmol/L (ref 22–32)
Calcium: 9.1 mg/dL (ref 8.9–10.3)
Chloride: 105 mmol/L (ref 98–111)
Creatinine, Ser: 0.58 mg/dL (ref 0.44–1.00)
GFR, Estimated: >60 mL/min (ref >60)
Glucose, Bld: 96 mg/dL (ref 70–99)
Potassium: 3.8 mmol/L (ref 3.5–5.1)
Sodium: 135 mmol/L (ref 135–145)
Total Bilirubin: 0.5 mg/dL (ref 0.3–1.2)
Total Protein: 7.1 g/dL (ref 6.5–8.1)

## 2022-02-09 LAB — TYPE AND SCREEN
ABO/RH(D): O NEG
Antibody Screen: NEGATIVE

## 2022-02-09 LAB — POC OCCULT BLOOD, ED: Fecal Occult Bld: POSITIVE — AB

## 2022-02-09 LAB — I-STAT BETA HCG BLOOD, ED (MC, WL, AP ONLY): I-stat hCG, quantitative: <5 m[IU]/mL (ref <5)

## 2022-02-09 MED ORDER — HYDROCORTISONE (PERIANAL) 2.5 % EX CREA: 1.0000 | TOPICAL_CREAM | Freq: Two times a day (BID) | CUTANEOUS | 0 refills | Status: AC

## 2022-02-09 NOTE — ED Triage Notes (Signed)
Pt arrived POV from home c/o intermittent bright red rectal bleeding x2 weeks. Pt denies having a hx of hemorrhoids. Pt states but for the last 2 days it has been more blood than usual and she feels like she is going more frequently.

## 2022-02-09 NOTE — ED Notes (Signed)
Patient up to nurses station stating that she has to leave to take care of her kids because her husband needs to go to work. Patient ambulating out of ED.

## 2022-02-09 NOTE — ED Provider Triage Note (Signed)
Emergency Medicine Provider Triage Evaluation Note  Cathy Bailey , a 26 y.o. female  was evaluated in triage.  Pt complains of intermittent rectal bleeding over the last 2 weeks.  Has noticed significantly increased bleeding over the last 2 days.  Sent by UC.  Stools described as difficult to pass, with occasional diarrhea, and with bright red blood.  Endorses pain with bleeding.  Hx of anemia, without need for prior infusions or transfusions.  Is supposed to be on iron supplementation, however has not been taking it.  Denies fever, chills, N/V, abdominal pain, or changes in urinary habits.  Denies lightheadedness or dizziness.  Hx of anxiety, depression, dysmenorrhea, and migraines.  Review of Systems  Positive:  Negative: See above  Physical Exam  BP 130/78 (BP Location: Right Arm)   Pulse 86   Temp 98.1 F (36.7 C) (Oral)   Resp 16   Ht 5\' 8"  (1.727 m)   Wt 97.5 kg   SpO2 99%   BMI 32.69 kg/m  Gen:   Awake, no distress   Resp:  Normal effort  MSK:   Moves extremities without difficulty  Other:  No diaphoresis.  Sitting comfortably.  Not pale.  Abdomen soft, nonTTP.  Medical Decision Making  Medically screening exam initiated at 10:18 AM.  Appropriate orders placed.  Cathy Bailey was informed that the remainder of the evaluation will be completed by another provider, this initial triage assessment does not replace that evaluation, and the importance of remaining in the ED until their evaluation is complete.     Cathy Alanis, PA-C 02/09/22 1027

## 2022-02-09 NOTE — ED Provider Notes (Signed)
[facility] EMERGENCY DEPARTMENT Provider Note   CSN: [id number] Arrival date & time: 02/09/22  5188     History Chief Complaint  Patient presents with   Rectal Bleeding    Cathy Bailey is a 26 y.o. female otherwise healthy presents the emergency room today for evaluation of painless rectal bleeding for the past two weeks, but has worsened over the past 2 days. She reports that initially had a few drops in the toilet or was there when she wiped. She has a h/o constipation and found she was having to strain more. She reports that when the bleeding increase, she started to take an OTC stool softner pill. Since then, she has had mixed diarrhea/soft stools and hard stools. Denies any black or tarry stools. She reports that she has been having more bowel movements over the past two days as well. Denies any fevers, nausea, vomiting, abdominal pain, dysuria, hematuria. Recently gave birth 8 months ago. She denies a history of hemorrhoids. Denies any trauma to her rectum/anus.    Rectal Bleeding Associated symptoms: no abdominal pain, no fever and no vomiting        Home Medications Prior to Admission medications   Medication Sig Start Date End Date Taking? Authorizing Provider  buPROPion (WELLBUTRIN XL) 150 MG 24 hr tablet TAKE 1 TABLET BY MOUTH EVERY DAY Patient not taking: Reported on 06/16/2021 06/11/21   Corie Chiquito, PMHNP  sertraline (ZOLOFT) 100 MG tablet TAKE 2 TABLETS BY MOUTH EVERY DAY Patient not taking: Reported on 06/16/2021 06/11/21   Corie Chiquito, PMHNP  cyclobenzaprine (FLEXERIL) 10 MG tablet Take 0.5-1 tablets (5-10 mg total) by mouth 3 (three) times daily as needed for muscle spasms. 05/26/21   Levie Heritage, DO  ondansetron (ZOFRAN ODT) 4 MG disintegrating tablet Take 1 tablet (4 mg total) by mouth every 6 (six) hours as needed for nausea. 12/09/20   Levie Heritage, DO  pantoprazole (PROTONIX) 20 MG tablet Take 20 mg by mouth 2 (two) times  daily.    [provider]  Prenatal Vit-Fe Fumarate-FA (PRENATAL PO) Take 1 tablet by mouth daily.    [provider]      Allergies    Patient has no known allergies.    Review of Systems   Review of Systems  Constitutional:  Negative for chills and fever.  Gastrointestinal:  Positive for anal bleeding, blood in stool, constipation, diarrhea and hematochezia. Negative for abdominal pain, nausea and vomiting.  Genitourinary:  Negative for dysuria and hematuria.    Physical Exam Updated Vital Signs BP 109/72   Pulse 85   Temp 98.7 F (37.1 C)   Resp 18   Ht 5\' 8"  (1.727 m)   Wt 97.5 kg   SpO2 100%   BMI 32.69 kg/m  Physical Exam Vitals and nursing note reviewed. Exam conducted with a chaperone present , RN).  Constitutional:      General: She is not in acute distress.    Appearance: Normal appearance. She is not ill-appearing or toxic-appearing.  HENT:     Head: Normocephalic and atraumatic.  Eyes:     General: No scleral icterus. Cardiovascular:     Rate and Rhythm: Normal rate and regular rhythm.  Pulmonary:     Effort: Pulmonary effort is normal.     Breath sounds: Normal breath sounds.  Abdominal:     General: Bowel sounds are normal. There is no distension.     Palpations: Abdomen is soft.  Tenderness: There is no abdominal tenderness. There is no guarding or rebound.  Genitourinary:    Rectum: External hemorrhoid present.     Comments: Multiple external hemorrhoids. There is a small, pea sized hardened external hemorrhoid. Small amount of bright red blood on digital exam. Clay colored stool.  Musculoskeletal:        General: No deformity.     Cervical back: Normal range of motion.  Skin:    General: Skin is warm and dry.  Neurological:     General: No focal deficit present.     Mental Status: She is alert. Mental status is at baseline.     ED Results / Procedures / Treatments   Labs (all labs ordered are listed, but only  abnormal results are displayed) Labs Reviewed  POC OCCULT BLOOD, ED - Abnormal; Notable for the following components:      Result Value   Fecal Occult Bld POSITIVE (*)    All other components within normal limits  COMPREHENSIVE METABOLIC PANEL  CBC WITH DIFFERENTIAL/PLATELET  I-STAT BETA HCG BLOOD, ED ([facility], [facility], [facility] ONLY)  TYPE AND SCREEN    EKG None  Radiology DG Abdomen 1 View  Result Date: 02/09/2022 CLINICAL DATA:  26 year old female with rectal bleeding for 1 week. EXAM: ABDOMEN - 1 VIEW COMPARISON:  None Available. FINDINGS: AP views of the abdomen and pelvis 1127 hours. Non obstructed bowel gas pattern. No significant retained stool. Abdominal visceral contours appear normal. IUD projects over the central pelvis. Incidental left hemipelvis phleboliths. No osseous abnormality identified. IMPRESSION: 1. Normal bowel gas pattern. No significant retained stool. 2. IUD projects over the central pelvis. Electronically Signed   By: Odessa Fleming M.D.   On: 02/09/2022 11:44    Procedures Procedures   Medications Ordered in ED Medications - No data to display  ED Course/ Medical Decision Making/ A&P                           Medical Decision Making Risk Prescription drug management.   26 year old female presents emerged department today for evaluation of painless rectal bleeding.  Differential diagnosis includes was limited to internal hemorrhoids, external hemorrhoids, inflammatory bowel disease, colitis, anemia.  Vital signs show slight hypertension at 106/51 however has been normal since being here.  Afebrile, normal pulse rate, satting well on room air without any increased work of breathing.  Physical exam as noted above.  Labs and imaging ordered in triage.  I intimately reviewed and interpreted the patient's labs.  Lab work is overall unremarkable.  CMP without electrolyte or LFT abnormality.  CBC without leukocytosis or anemia.  Patient is fecal occult positive however I do think  this is false given that there was bright red blood on exam likely from a hemorrhoid.  Her pregnancy is negative as well.  X-ray imaging shows 1. Normal bowel gas pattern. No significant retained stool. 2. IUD projects over the central pelvis.   Given the patient is having painless rectal bleeding, I do not think any additional imaging is needed at this time.  Her abdomen is soft and nontender.  She has not had a significant drop in her blood count.  Her rectal bleeding is likely due to her numerous external hemorrhoids that she has present.  Given that she will likely need to follow-up with a general surgeon for further evaluation.  The patient left prior to being officially discharged. I called the patient and spoke with her about  her lab findings. I verbally gave her the information for [facility] for her to follow up with. She confirmed her pharmacy and will send in the anusol. We discussed strict return precautions and red flag symptoms. The patient is stable and being discharged home in good condition.   Final Clinical Impression(s) / ED Diagnoses Final diagnoses:  Hemorrhoids, unspecified hemorrhoid type    Rx / DC Orders ED Discharge Orders          Ordered    hydrocortisone (ANUSOL-HC) 2.5 % rectal cream  2 times daily        02/09/22 1742              Achille Rich, PA-C 02/11/22 Silva Bandy    Benjiman Core, MD 02/16/22 1452

## 2022-07-28 ENCOUNTER — Emergency Department (HOSPITAL_BASED_OUTPATIENT_CLINIC_OR_DEPARTMENT_OTHER): Payer: BLUE CROSS/BLUE SHIELD

## 2022-07-28 ENCOUNTER — Emergency Department (HOSPITAL_BASED_OUTPATIENT_CLINIC_OR_DEPARTMENT_OTHER)
Admission: EM | Admit: 2022-07-28 | Discharge: 2022-07-28 | Disposition: A | Discharge status: Home / Self Care | Payer: BLUE CROSS/BLUE SHIELD | Attending: Emergency Medicine | Admitting: Emergency Medicine

## 2022-07-28 ENCOUNTER — Other Ambulatory Visit: Payer: Self-pay

## 2022-07-28 ENCOUNTER — Encounter (HOSPITAL_BASED_OUTPATIENT_CLINIC_OR_DEPARTMENT_OTHER): Payer: Self-pay

## 2022-07-28 DIAGNOSIS — Z3A01 Less than 8 weeks gestation of pregnancy: Secondary | ICD-10-CM | POA: Diagnosis not present

## 2022-07-28 DIAGNOSIS — O209 Hemorrhage in early pregnancy, unspecified: Secondary | ICD-10-CM | POA: Diagnosis present

## 2022-07-28 LAB — BASIC METABOLIC PANEL
Anion gap: 11 (ref 5–15)
BUN: 11 mg/dL (ref 6–20)
CO2: 21 mmol/L — ABNORMAL LOW (ref 22–32)
Calcium: 9 mg/dL (ref 8.9–10.3)
Chloride: 106 mmol/L (ref 98–111)
Creatinine, Ser: 0.58 mg/dL (ref 0.44–1.00)
GFR, Estimated: >60 mL/min (ref >60)
Glucose, Bld: 108 mg/dL — ABNORMAL HIGH (ref 70–99)
Potassium: 3.7 mmol/L (ref 3.5–5.1)
Sodium: 138 mmol/L (ref 135–145)

## 2022-07-28 LAB — CBC WITH DIFFERENTIAL/PLATELET
Abs Immature Granulocytes: 0.06 10*3/uL (ref 0.00–0.07)
Basophils Absolute: 0 10*3/uL (ref 0.0–0.1)
Basophils Relative: 0 %
Eosinophils Absolute: 0.2 10*3/uL (ref 0.0–0.5)
Eosinophils Relative: 1 %
HCT: 38.4 % (ref 36.0–46.0)
Hemoglobin: 12.7 g/dL (ref 12.0–15.0)
Immature Granulocytes: 1 %
Lymphocytes Relative: 34 %
Lymphs Abs: 3.7 10*3/uL (ref 0.7–4.0)
MCH: 27.3 pg (ref 26.0–34.0)
MCHC: 33.1 g/dL (ref 30.0–36.0)
MCV: 82.4 fL (ref 80.0–100.0)
Monocytes Absolute: 0.9 10*3/uL (ref 0.1–1.0)
Monocytes Relative: 8 %
Neutro Abs: 6.2 10*3/uL (ref 1.7–7.7)
Neutrophils Relative %: 56 %
Platelets: 317 10*3/uL (ref 150–400)
RBC: 4.66 MIL/uL (ref 3.87–5.11)
RDW: 13.2 % (ref 11.5–15.5)
WBC: 11.1 10*3/uL — ABNORMAL HIGH (ref 4.0–10.5)
nRBC: 0 % (ref 0.0–0.2)

## 2022-07-28 LAB — HCG, QUANTITATIVE, PREGNANCY: hCG, Beta Chain, Quant, S: 1195 m[IU]/mL — ABNORMAL HIGH (ref <5)

## 2022-07-28 NOTE — Discharge Instructions (Signed)
You were evaluated today for vaginal bleeding.  Your pregnancy test and ultrasound show evidence of pregnancy.  Many different things could potentially be causing bleeding during her first trimester.  Please follow-up with your OB/GYN for further evaluation and management.  Ultrasound results are listed below Eccentric intracavitary cystic structure in keeping with an  intrauterine gestational sac. Non visualization of the yolk sac and  fetal pole roughly estimates the gestational between 5.0 and 5.5  weeks. Follow-up sonography is recommended in 14-21 days to document  appropriate progression and better age the gestation

## 2022-07-28 NOTE — ED Triage Notes (Signed)
Patient here POV from Home  Newly pregnant. Seeks Evaluation for Bleeding noted when wiping today. 2 Discernable Episodes with Clots noted the second time.   LMP: 04/24. Second Pregnancy. Some Intermittent Cramping as well.   NAD Noted during triage. A&Ox4. GCS 15. Ambulatory.

## 2022-07-28 NOTE — ED Provider Notes (Signed)
[place] EMERGENCY DEPARTMENT AT [facility] Provider Note   CSN: [id number] Arrival date & time: 07/28/22  1844     History  Chief Complaint  Patient presents with   Vaginal Bleeding    Schwanna Siek is a 27 y.o. female.  Patient presents to the emergency department complaining of vaginal bleeding.  Patient states that during the day she has gone to the bathroom multiple times and is noted blood in the toilet along with blood when wiping.  She also endorses 2 episodes where she saw clotting.  Her last menstrual period was April 24.  She states she has had multiple positive home pregnancy tests over the past few days.  She does endorse intermittent cramping.  She denies abdominal pain, nausea, vomiting, chest pain, shortness of breath, dysuria.  Past medical history significant for irregular periods, dysmenorrhea  HPI     Home Medications Prior to Admission medications   Medication Sig Start Date End Date Taking? Authorizing Provider  buPROPion (WELLBUTRIN XL) 150 MG 24 hr tablet TAKE 1 TABLET BY MOUTH EVERY DAY Patient not taking: Reported on 06/16/2021 06/11/21   Corie Chiquito, PMHNP  docusate sodium (COLACE) 100 MG capsule Take 100 mg by mouth daily as needed (constipation).    [provider]  hydrocortisone (ANUSOL-HC) 2.5 % rectal cream Place 1 Application rectally 2 (two) times daily. 02/09/22   Achille Rich, PA-C  sertraline (ZOLOFT) 100 MG tablet TAKE 2 TABLETS BY MOUTH EVERY DAY Patient taking differently: Take 200 mg by mouth in the morning. 06/11/21   Corie Chiquito, PMHNP      Allergies    Patient has no known allergies.    Review of Systems   Review of Systems  Physical Exam Updated Vital Signs BP 138/77 (BP Location: Right Arm)   Pulse (!) 102   Temp 97.9 F (36.6 C) (Temporal)   Resp 18   Ht 5\' 8"  (1.727 m)   Wt 97.5 kg   LMP 06/24/2022   SpO2 99%   BMI 32.68 kg/m  Physical Exam Vitals and nursing note reviewed.   Constitutional:      General: She is not in acute distress.    Appearance: She is well-developed.  HENT:     Head: Normocephalic and atraumatic.  Eyes:     Conjunctiva/sclera: Conjunctivae normal.  Cardiovascular:     Rate and Rhythm: Normal rate and regular rhythm.     Heart sounds: No murmur heard. Pulmonary:     Effort: Pulmonary effort is normal. No respiratory distress.     Breath sounds: Normal breath sounds.  Abdominal:     Palpations: Abdomen is soft.     Tenderness: There is no abdominal tenderness.  Musculoskeletal:        General: No swelling.     Cervical back: Neck supple.  Skin:    General: Skin is warm and dry.     Capillary Refill: Capillary refill takes less than 2 seconds.  Neurological:     Mental Status: She is alert.  Psychiatric:        Mood and Affect: Mood normal.     ED Results / Procedures / Treatments   Labs (all labs ordered are listed, but only abnormal results are displayed) Labs Reviewed  BASIC METABOLIC PANEL - Abnormal; Notable for the following components:      Result Value   CO2 21 (*)    Glucose, Bld 108 (*)    All other components within normal limits  CBC WITH  DIFFERENTIAL/PLATELET - Abnormal; Notable for the following components:   WBC 11.1 (*)    All other components within normal limits  HCG, QUANTITATIVE, PREGNANCY - Abnormal; Notable for the following components:   hCG, Beta Chain, Quant, S 1,195 (*)    All other components within normal limits    EKG None  Radiology US OB LESS THAN 14 WEEKS WITH OB TRANSVAGINAL  Result Date: 07/28/2022 CLINICAL DATA:  Vaginal bleeding EXAM: OBSTETRIC <14 WK [place] AND TRANSVAGINAL OB US TECHNIQUE: Both transabdominal and transvaginal ultrasound examinations were performed for complete evaluation of the gestation as well as the maternal uterus, adnexal regions, and pelvic cul-de-sac. Transvaginal technique was performed to assess early pregnancy. COMPARISON:  None Available. FINDINGS:  Intrauterine gestational sac: Present, single Yolk sac:  Not visualized Embryo:  Not visualized Cardiac Activity: Not applicable MSD: 3 mm   5 w   0 d CRL: Not applicable Subchorionic hemorrhage:  None visualized. Maternal uterus/adnexae: The uterus is anteverted. The cervix is unremarkable. No intrauterine masses are seen. The maternal ovaries are unremarkable. No free fluid seen within the cul-de-sac. IMPRESSION: Eccentric intracavitary cystic structure in keeping with an intrauterine gestational sac. Non visualization of the yolk sac and fetal pole roughly estimates the gestational between 5.0 and 5.5 weeks. Follow-up sonography is recommended in 14-21 days to document appropriate progression and better age the gestation. Electronically Signed   By: Helyn Numbers M.D.   On: 07/28/2022 21:10    Procedures Procedures    Medications Ordered in ED Medications - No data to display  ED Course/ Medical Decision Making/ A&P                             Medical Decision Making Amount and/or Complexity of Data Reviewed Labs: ordered. Radiology: ordered.   This patient presents to the ED for concern of menstrual bleeding, this involves an extensive number of treatment options, and is a complaint that carries with it a high risk of complications and morbidity.  The differential diagnosis includes regular menstrual cycle, early pregnancy, others   Co morbidities that complicate the patient evaluation  History of irregular periods   Additional history obtained:  Additional history obtained from visitor at bedside   Lab Tests:  I Ordered, and personally interpreted labs.  The pertinent results include: Beta-hCG 1195, WBC 11.1, unremarkable BMP   Imaging Studies ordered:  I ordered imaging studies including ultrasound OB with transvaginal I independently visualized and interpreted imaging which showed  Eccentric intracavitary cystic structure in keeping with an  intrauterine gestational  sac. Non visualization of the yolk sac and  fetal pole roughly estimates the gestational between 5.0 and 5.5  weeks. Follow-up sonography is recommended in 14-21 days to document  appropriate progression and better age the gestation   I agree with the radiologist interpretation    Test / Admission - Considered:  Patient with vaginal bleeding in the context of early pregnancy.  This could be an early implantation bleeding, early miscarriage, or other abnormality.  No life-threatening condition identified at this time.  Vitals are stable.  Plan to have patient follow-up with her OB for further evaluation and management.         Final Clinical Impression(s) / ED Diagnoses Final diagnoses:  Vaginal bleeding in pregnant patient at less than [redacted] weeks gestation    Rx / DC Orders ED Discharge Orders     None  Pamala Duffel 07/28/22 2129    Benjiman Core, MD 07/28/22 [phone]

## 2023-01-13 ENCOUNTER — Encounter: Payer: Self-pay | Admitting: Psychiatry

## 2024-04-06 ENCOUNTER — Ambulatory Visit: Admitting: Family Medicine
